# Patient Record
Sex: Male | Born: 1950 | Race: White | Hispanic: No
Health system: Midwestern US, Academic
[De-identification: ages and names within clinical notes are randomized; demographics above are authoritative.]

---

## 2016-02-04 ENCOUNTER — Ambulatory Visit: Admit: 2016-02-04 | Discharge: 2016-02-04 | Payer: Medicare (Managed Care) | Attending: Acute Care

## 2016-02-04 DIAGNOSIS — R0602 Shortness of breath: Secondary | ICD-10-CM

## 2016-02-04 NOTE — Unmapped (Signed)
-----   Message from Irven Shelling, [place] sent at 02/04/2016  3:22 PM EST -----  Communication order for oxygen to new DME  Try to get old records from MD in [place]

## 2016-02-04 NOTE — Progress Notes (Signed)
Initial Outpatient Pulmonary Medicine Consultation     REASON FOR PULMONARY CONSULT:  Chief Complaint   Patient presents with    New Patient Visit/ Consultation     dx with copd around 5-6 years agodx at stage 4,moved back from [place]       HPI  Derrick Garner is a 65 y.o. male who presents for New Patient Visit/ Consultation (dx with copd around 5-6 years agodx at stage 4,moved back from [place])  Pt referred by PCP for COPD, on 2Lnc 02 for last 3 months, diagnosed with COPD 5 yrs ago, quit smoking a month ago, on spiriva, advair he uses sometimes and albuterol HHN and MDI, moved from [place] recently to be closer to his mother. Pt was put on prednisone for wt loss, 20mg  daily for last few months has gained 8lbs. Has been off for a week. Does not feel any different off of prednisone. Has home concentrator and two refillable portable 02 tanks he fills from his concentrator. No back up tank.  Pt seen by pulmonary in [place], [facility].       PAST MEDICAL HISTORY  Past Medical History:   Diagnosis Date    Anxiety     COPD (chronic obstructive pulmonary disease) with emphysema (HCC)     History of alcohol abuse     Hypoxemia requiring supplemental oxygen     Tobacco abuse        SURGICAL HISTORY  Past Surgical History:   Procedure Laterality Date    COLONOSCOPY W/ BIOPSIES AND POLYPECTOMY         CURRENT HOME MEDICATIONS  Current Outpatient Prescriptions   Medication Sig    albuterol Inhale into the lungs.    ALPRAZolam Take by mouth.    fluticasone-salmeterol Inhale into the lungs.    tiotropium Inhale into the lungs.    predniSONE Take by mouth.     No current facility-administered medications for this visit.        ALLERGIES  No Known Allergies    SOCIAL HISTORY  Social History     Social History    Marital status: Divorced     Spouse name: N/A    Number of children: N/A    Years of education: N/A     Social History Main Topics    Smoking status: Former Smoker     Packs/day: 1.50     Years: 45.00      Types: Cigarettes, Cigars     Quit date: 12/28/2015    Smokeless tobacco: Never Used    Alcohol use No      Comment: quit 7yrs ago, hx of heavy alcohol abuse prior    Drug use: Unknown     Types: Marijuana      Comment: Hx of marijuana use not for last 60yrs    Sexual activity: Not Asked     Other Topics Concern    None     Social History Narrative    None     Living with brother and sister in law. 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 One dog.    FAMILY HISTORY  Family History   Problem Relation Age of Onset    Cancer Father     Lung cancer Father     COPD Maternal Uncle        ROS:  CONSTITUTIONAL: No fever/chills or night sweats, no recent weight change.  No sick contacts.  HENT:  No epistaxsis, no nasal discharge, no  sore throat.  CARDIAC: No chest pains or palpitations, no lower extremity edema, no orthopnea, or PND.  RESPIRATORY: No chronic cough, + wheeze, and chronic SOB, does not walk much  GI:  No dysphagia, + heartburn, no melena, no hematochezia.  MUSCULOSKELETAL: No joint pain.  No Raynaud's Phenomenon.  SKIN: No rashes or lesions.  NEURO: No morning headaches, syncope, or TIA symptoms.  PSYCHIATRIC: + depression and  Anxiety has been on xanax for years      PHYSICAL EXAM:  VITAL SIGNS:   Vitals:    02/04/16 1313 02/04/16 1318   BP: 118/72    BP Location: Left arm    Patient Position: Sitting    BP Cuff Size: Regular    Pulse: 105    SpO2: 96% 96%   Weight: 128 lb 12 oz (58.4 kg)    Height: 5' 11 (1.803 m)      Wt Readings from Last 3 Encounters:   02/04/16 128 lb 12 oz (58.4 kg)     GEN: Mild respiratory distress, alert and oriented x3, very thin and chronically ill appearing  HEENT: Atraumatic, normocephalic, pupils equal reactive to light, extraocular movements intact, sclera anicteric, no maxillary or frontal sinus tenderness to palpation,  mucous membranes moist, no oral lesions. Mallampati grade 3.  NECK:  supple, no masses, no lymphadenopathy  CARDIOVASCULAR:  Regular Rate and Rhythm, no murmurs gallops or rubs. Radial pulse 2+ and equal.   LUNGS: Very diminished to ausculation bilaterally, no wheeze, no rhonchi, no rales, no egophony, no dullness to percussion  ABDOMEN: Soft, non-tender, nondistended, bowel sounds present  SKIN: Warm, dry  EXTREMITIES: No clubbing, no pitting edema.  NEURO: CN 2-12 grossly intact. No gross focal deficits.    PSYCH: Normal affect and mood.    Data:                IMPRESSION:   1. Chronic Hypoxemic Respiratory Failure, needs 3L continuous with activity  2. COPD  3. Hx Heavy Tobacco Abuse  4. Hx Alcohol abuse  5. Anxiety  6. GERD    PLAN:   1. Low dose lung cancer screening, ordered  2. Full PFT   3. 6 min walk in office today,88% on RA, walked with 3L conserving device and desat to 85%, walked with 2Lnc and dropped to 88% could not complete 6 min walk  4. Needs humidifier for home concentrator  5. Interested in getting portable home concentrator, will not tolerate conserving device, not appropriate for him  6. Send order to new DME for oxygen supplies, he is worried about losing the money he has paid towards his rent to own concentrator from DME in [place]  7. Try to get old records from [place] if possible  8. Trial of Prilosec for GERD  9. Cont spiriva  10. Cont advair discussed needs to take as directed  11. Cont albuterol HHN and MDI PRN  12. Wants to follow up with doctor for next visit

## 2016-02-04 NOTE — Patient Instructions (Signed)
please call [phone]. To schedule Lung cancer screening CT Scan

## 2016-02-04 NOTE — Telephone Encounter (Signed)
Faxed order,office note and demos for pts O2 to [facility]  Fax confirmation received

## 2016-02-05 NOTE — Telephone Encounter (Signed)
Chase from [facility] requesting Rx & qualifying testing for order.    Ph   [phone]  Fax  [phone]

## 2016-02-06 NOTE — Telephone Encounter (Signed)
Lm with [facility] to call office back.

## 2016-02-06 NOTE — Telephone Encounter (Signed)
Derrick Garner from [facility] called and said that he received the oxygen order but it did not have any liter flow on it . He states that it says 85 on 2 liters and 83 on 3 liters but doesn't actually have a liter flow on it. Please advise. [name] is requesting that we re-fax the order. Please advise.

## 2016-02-06 NOTE — Telephone Encounter (Signed)
Faxed 6mw to [facility]

## 2016-02-09 NOTE — Telephone Encounter (Signed)
Chase from [facility] f/up on msg from Friday.   Pls advise.

## 2016-02-09 NOTE — Telephone Encounter (Signed)
Faxed order to [facility] at [place].

## 2016-02-10 NOTE — Telephone Encounter (Signed)
Faxed oxygen/nebulizer therapy order form to [facility]  -confirmation received and sent to scan.

## 2016-03-02 NOTE — Telephone Encounter (Signed)
Faxed initial prescription for oxygen to [facility].

## 2016-03-05 ENCOUNTER — Ambulatory Visit: Admit: 2016-03-05 | Payer: Medicare (Managed Care)

## 2016-03-05 DIAGNOSIS — R972 Elevated prostate specific antigen [PSA]: Secondary | ICD-10-CM

## 2016-03-05 MED ORDER — tamsulosin (FLOMAX) 0.4 mg Cp24
0.4 | ORAL_CAPSULE | Freq: Every evening | ORAL | 3 refills | Status: AC
Start: 2016-03-05 — End: 2016-09-13

## 2016-03-05 NOTE — Unmapped (Signed)
Chief complaints:    Hematuria   Elevated PSA    History of Present Illness  HEMATURIA    Chief Complaint:    65 year old male who is here because of a history of elevated PSA.  He has been referred to [place] by Dr. Dessie Garner.  He has a long-standing history of lower urinary tract symptoms i.e. frequency, urgency and nocturia for many years.  He had a transurethral resection of the prostate, 3 years ago in [place].  This was complicated by gross hematuria and retention of urine and subsequently.  He has grade 4 COPD and is currently on oxygen.  He continues to have urinary frequency and nocturia.  His latest PSA was 10.4 ng per mL.  He had a urinary tract infection a few weeks ago and was treated for the same.      PSA is 10 ng/ml.      Review of Systems   Genitourinary: Positive for difficulty urinating, frequency, hematuria and nocturia.   All other systems reviewed and are negative.      Allergies  Patient has no known allergies.    Medications  Outpatient Encounter Prescriptions as of 03/05/2016   Medication Sig Dispense Refill   ??? albuterol (PROVENTIL;VENTOLIN;PROAIR) 90 mcg/actuation inhaler Inhale into the lungs.     ??? ALPRAZolam (XANAX) 1 MG tablet Take by mouth.     ??? fluticasone-salmeterol (ADVAIR) 250-50 mcg/dose diskus inhaler Inhale into the lungs.     ??? predniSONE (DELTASONE) 20 MG tablet Take by mouth.     ??? tiotropium (SPIRIVA) 18 mcg Inhale into the lungs.       No facility-administered encounter medications on file as of 03/05/2016.         Histories  He has a past medical history of Anxiety; COPD (chronic obstructive pulmonary disease) with emphysema (HCC); History of alcohol abuse; Hypoxemia requiring supplemental oxygen; and Tobacco abuse.    He has a past surgical history that includes Colonoscopy w/ biopsies and polypectomy.    His family history includes COPD in his maternal uncle; Cancer in his father; Lung cancer in his father.    He reports that he quit smoking about 2 months ago. His smoking use  included Cigarettes and Cigars. He has a 67.50 pack-year smoking history. He has never used smokeless tobacco. He reports that he has current or past drug history, including Marijuana. He reports that he does not drink alcohol.    The following portions of the patient's history were reviewed and updated as appropriate: allergies, current medications, past family history, past medical history, past social history, past surgical history and problem list.    There were no vitals taken for this visit.  Physical Exam   Constitutional: He is oriented to person, place, and time. He appears well-developed and well-nourished.   HENT:   Head: Normocephalic and atraumatic.   Mouth/Throat: Oropharynx is clear and moist.   Eyes: EOM are normal.   Neck: Normal range of motion. Neck supple.   Cardiovascular: Normal rate, regular rhythm and normal heart sounds.    Pulmonary/Chest: Effort normal and breath sounds normal. He has no wheezes.   Abdominal: Soft. Bowel sounds are normal. He exhibits no distension. There is no tenderness. There is no rebound and no guarding. Hernia confirmed negative in the right inguinal area and confirmed negative in the left inguinal area.   Genitourinary: Testes normal and penis normal. Prostate is enlarged. Prostate is not tender.   Musculoskeletal: Normal range of motion.  Lymphadenopathy:        Right: No inguinal adenopathy present.        Left: No inguinal adenopathy present.   Neurological: He is alert and oriented to person, place, and time.   Skin: Skin is warm.   Psychiatric: He has a normal mood and affect. His behavior is normal. Judgment and thought content normal.            Assessment    Derrick Garner is a  65 y.o.  yr. old malew who is referred to me by Dr.Burghard  for the evaluation and management of elevated PSA and lower urinary tract symptoms.  Please see history of presenting illness for details. He has had a long-standing history of lower urinary tract symptoms. He has had a  transurethral resection of the prostate 3 years ago.    Clinical examination reveals an enlarged prostate. Feels benign.  Given the history of recent urinary tract infection, possible history of transurethral resection of the prostate, and lower urinary tract symptoms, I think he should be on Flomax.  After being on Flomax for a few months, we will repeat the PSA. I have asked him to get the PSA in the UC lab.  He will see me in 3 months.     Plan:  1. Tablet tamsulosin 0.4 mg at bedtime  2. PSA free and total in 3 months  3. Follow-up in 3 months  4. Early follow-up if he has any problems        Medical Decision Making  The following items were considered in medical decision making:  Review / order clinical lab tests  Review / order radiology tests  Review / order other diagnostic tests/interventions  Reviewed outside records

## 2016-03-08 ENCOUNTER — Inpatient Hospital Stay: Admit: 2016-03-08 | Payer: Medicare (Managed Care) | Attending: Acute Care

## 2016-03-08 DIAGNOSIS — R0602 Shortness of breath: Secondary | ICD-10-CM

## 2016-03-08 LAB — PFT13-PULMONARY FUNCTION TEST
DLCO%: 23
DLCO: 7.29
FEV1%: 20
FEV1/FVC EXP: 75
FEV1/FVC: 25
FEV1: 0.7
FVC%: 58
FVC: 2.78
RESPONSE TO BRONCHODILATOR: 6
RV%: 258
RV: 6.61
TLC%: 129
TLC: 9.44
VC%: 59
VC: 2.83

## 2016-03-08 MED ORDER — albuterol (PROVENTIL) nebulizer solution 2.5 mg
2.5 | Freq: Once | RESPIRATORY_TRACT | Status: AC
Start: 2016-03-08 — End: 2016-03-08
  Administered 2016-03-08: 19:00:00 2.5 mg via RESPIRATORY_TRACT

## 2016-03-08 MED FILL — ALBUTEROL SULFATE 2.5 MG/3 ML (0.083 %) SOLUTION FOR NEBULIZATION: 2.5 2.5 mg /3 mL (0.083 %) | RESPIRATORY_TRACT | Qty: 3

## 2016-03-08 NOTE — Consults (Signed)
[facility]    Pulmonary Function Tests Interpretation    Derrick Garner is a 65 y.o. male who had a pulmonary function test performed at the [facility].    Obstructive defect is present.  Spirometry shows a very severe obstructive defect.  There is a significant response to bronchodilator demonstrated.  Hyperinflation is present.  Air trapping is present.  Diffusing capacity is severely decreased.    Read performed by:  Metro Kung, MD 03/09/2016 3:47 PM        PFT/ABG/6MW/10MW 03/08/2016   FEV1 (liters) 0.70   FEV1% 20   FVC (liters) 2.78   FVC% 58   FEV1/FVC (%) 25   TLC (liters) 9.44   TLC% 129   RV 6.61   RV% 258   VC 2.83   VC% 59   DLCO (ml/mmHg sec) 7.29   DLCO % 23

## 2016-03-12 NOTE — Telephone Encounter (Signed)
Spoke with pt adv that he should call the lung cancer program and see about having that done prior to his appt on 04-05-16.  Pt sts that he will call Monday and see about getting an appt for around the first of the year.

## 2016-03-15 ENCOUNTER — Encounter

## 2016-03-15 ENCOUNTER — Encounter: Payer: Medicare (Managed Care) | Attending: Acute Care

## 2016-03-24 NOTE — Progress Notes (Signed)
Derrick Garner called the lung screening office after talking with pulmonologist, Derrick Garner.  Initial nursing intake assessment completed to confirm eligibility.  Reviewed risks and benefits of low dose CT during conversation.  Instructed him to check in at the registration desk before his scheduled time and provide a vigorous cough immediately prior to scan to remove mucous secretions.  Requested pt call the screening office 24-48 hours post scan to review final report.  Informed that a patient letter would be mailed to the home address with follow-up instructions and appointment scheduled per recommendation of the reading radiologist, as well as a copy of ldct report. Pt had no additional questions and verbalized understanding.       LUNG CANCER SCREENING INITIAL NURSING ASSESSMENT    Patient Name: Derrick Garner  DOB: 12-05-1950  Age65 y.o. [id number]    Gender   Race: White or Caucasian    Address: [address] [place] [address]    Home Phone: [phone]     Cell Phone:      Other:     Referring Physician: Netta Neat, Garner   PCP:Joe Heron Nay, MD     Next Appt:    Primary Insurance: [employer]   ID:     How did you learn about the program?: pulmonologist    Height: 71   Weight: 132       Eligibility Screening  Smoking History  Current smoker: no     Year started: 14     Year stopped 2017 (quit for 5 years once then restarted)    Packs Per Day 1.5     Years Smoked 40     Pack Years 48     Quit smoking for < 15 years (2002 - 2017): yes    Smoke Cessation Offered:na     Risk Factors    COPD / Asthma, Emphysema, Bronchitis: COPD     Pulmonary Fibrosis: no         Radon Exposure: no    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     Shared Decision Making per Patient Report yes    Prior Chest Imaging: no , If yes where/when/location     Acquired:na     ELIGIBLE FOR SCREENING yes     Date/Time/Location: 04/07/16 [facility]

## 2016-04-05 ENCOUNTER — Ambulatory Visit: Payer: Medicare (Managed Care) | Attending: Acute Care

## 2016-04-07 ENCOUNTER — Inpatient Hospital Stay: Payer: Medicare (Managed Care)

## 2016-04-21 ENCOUNTER — Inpatient Hospital Stay: Admit: 2016-04-21 | Payer: Medicare (Managed Care)

## 2016-04-21 ENCOUNTER — Ambulatory Visit: Admit: 2016-04-21 | Discharge: 2016-04-21 | Payer: Medicare (Managed Care) | Attending: Acute Care

## 2016-04-21 DIAGNOSIS — Z122 Encounter for screening for malignant neoplasm of respiratory organs: Secondary | ICD-10-CM

## 2016-04-21 DIAGNOSIS — J449 Chronic obstructive pulmonary disease, unspecified: Secondary | ICD-10-CM

## 2016-04-21 MED ORDER — albuterol (PROVENTIL;VENTOLIN;PROAIR) 90 mcg/actuation inhaler
90 | RESPIRATORY_TRACT | 11 refills | Status: AC | PRN
Start: 2016-04-21 — End: ?

## 2016-04-21 NOTE — Unmapped (Signed)
Spoke with Flint Melter at [facility]. Requested to see if pt had a CT Chest or PFT.  She sent a message to her secretary to check on these records. I left our fax and phone number.

## 2016-04-21 NOTE — Unmapped (Signed)
Outpatient Pulmonary Medicine Follow Up visit       HPI  Derrick Garner is a 66 y.o. male who presents for Follow-up (pft results and just had ct scan at 2pm . insurance will no longer pay for the Proventil HFA); Cough (coughing up a lot of phelgm- clear, some yellow sometimes it looks like a bubble. comes and goes. Thinks maybe it is from certain foods. he will have a coughing fit with food. ); Sinus Problem (runny nose. ); Fatigue (could sleep all day and could be up all night. ); and Edema (feet, pain sometimes. )  Overall feeling about the same. SOB the same, some cough with clear to yellow sputum at times, no change. No fevers or chills. Had PFT and CT lung cancer screening since last visit. Have not gotten records from previous pulmonary doctor, he says was a close friend and did not charge him for his visits. Got new oxygen equipment from DME and happy with the service.     Previous Note:  Pt referred by PCP for COPD, on 2Lnc 02 for last 3 months, diagnosed with COPD 5 yrs ago, quit smoking a month ago, on spiriva, advair he uses sometimes and albuterol HHN and MDI, moved from [place] recently to be closer to his mother. Pt was put on prednisone for wt loss, 20mg  daily for last few months has gained 8lbs. Has been off for a week. Does not feel any different off of prednisone. Has home concentrator and two refillable portable 02 tanks he fills from his concentrator. No back up tank.  Pt seen by pulmonary in [place], [facility].     ROS:  CONSTITUTIONAL: No fever/chills or night sweats, no recent weight change.  No sick contacts.  HENT:  No epistaxsis, no nasal discharge, no sore throat.  CARDIAC: No chest pains or palpitations, no lower extremity edema, no orthopnea, or PND.  RESPIRATORY: No chronic cough, + wheeze, and chronic SOB, does not walk much  GI:  No dysphagia, + heartburn, no melena, no hematochezia.  MUSCULOSKELETAL: No joint pain.  No Raynaud's Phenomenon.  SKIN: No rashes or lesions.  NEURO: No  morning headaches, syncope, or TIA symptoms.  PSYCHIATRIC: + depression and  Anxiety has been on xanax for years      PHYSICAL EXAM:  VITAL SIGNS:   Vitals:    04/21/16 1450   Pulse: 93   SpO2: 96%   Weight: 132 lb (59.9 kg)   Height: 5' 11 (1.803 m)     Wt Readings from Last 3 Encounters:   04/21/16 132 lb (59.9 kg)   03/05/16 132 lb (59.9 kg)   02/04/16 128 lb 12 oz (58.4 kg)     GEN: Mild respiratory distress, alert and oriented x3, very thin and chronically ill appearing  HEENT: Atraumatic, normocephalic, pupils equal reactive to light, extraocular movements intact, sclera anicteric, no maxillary or frontal sinus tenderness to palpation,  mucous membranes moist, no oral lesions. Mallampati grade 3.  NECK:  supple, no masses, no lymphadenopathy  CARDIOVASCULAR: Regular Rate and Rhythm, no murmurs gallops or rubs. Radial pulse 2+ and equal.   LUNGS: Very diminished to ausculation bilaterally, no wheeze, no rhonchi, no rales, no egophony, no dullness to percussion  ABDOMEN: Soft, non-tender, nondistended, bowel sounds present  SKIN: Warm, dry  EXTREMITIES: No clubbing, no pitting edema.  NEURO: CN 2-12 grossly intact. No gross focal deficits.    PSYCH: Normal affect and mood.    Data:  03/08/16 PFT  Pulmonary function test   Order: [id number]   Status:  Final result ????Visible to patient:  No (Not Released) Next appt:  06/04/2016 at 02:00 PM in Urology Surgery Timoteo Expose, MD) Dx:  Hypoxemia requiring supplemental oxyg...    03/08/16 12:41 PM   FEV1 0.70    FEV1% 20    FVC 2.78    FVC% 58    FEV1/FVC 25    FEV1/FVC EXP 75    RESPONSE TO BRONCHODILATOR 6% FVC, 5% FEV1    TLC 9.44    TLC% 129    RV 6.61    RV% 258    VC 2.83    VC% 59    DLCO 7.29    DLCO% 23            Obstructive defect is present.  Spirometry shows a very severe obstructive defect.  There is a significant response to bronchodilator demonstrated.  Hyperinflation is present.  Air trapping is present.  Diffusing capacity is severely  decreased.    02/04/16  6 min walk in office today,88% on RA, walked with 3L conserving device and desat to 85%, walked with 2Lnc and dropped to 88% could not complete 6 min walk      04/21/16 CT Lung Cancer Screening  IMPRESSION:  ??  6 mm subpleural right lower lobe nodule is indeterminate. Compare to any prior exams if available otherwise follow-up as below.  ??  Right middle lobe bronchiectasis may be related to may be postinflammatory, another consideration is atypical mycobacterial infection given location.  ??  Irregular opacity at bilateral lung apices is likely due to prior granulomatous disease. Recommend comparison with any prior imaging to assess for stability or attention on follow-up.  ??  Severe dilation of left renal pelvis without identifiable cause on chest CT. Recommend comparison to any prior imaging or further evaluation.  ??  Hyperdense 1.5 cm lesion left renal upper pole, cannot be characterized without intravenous contrast and consider renal protocol CT or ultrasound for further evaluation.  ??  Significant dilation of distal stomach/ proximal duodenum with heterogeneous density within this region which may represent retained material however is not well evaluated and assessment for possible mass is limited.            IMPRESSION:   1. Chronic Hypoxemic Respiratory Failure, needs 3L continuous with activity  2. Very Severe COPD  3. Severe Emphysema  4. Hx Heavy Tobacco Abuse  5. Hx Alcohol abuse  6. Anxiety  7. GERD  8. LE Edema    PLAN:   1. Alpha 1 antitrypsin with severe emphysema  2. Full PFT reviewed Very Sever Obstruction with significant bronchodilator response, FEV 1 20%  3. CT Lung cancer screening reviewed with Dr. Damaris Hippo in office, RLL subpleural nodule 6mm, noted will need follow up in 3 months, RML bronchiectasis, possible infectious, consider bronchoscopy, hold off for now and get sputum cx and sputum afb  4. Previously discussed he is Interested in Secondary school teacher,  will not tolerate conserving device, not appropriate for him  5. Try to get old records from [place] if possible  6. Trial of Prilosec for GERD  7. Cont spiriva  8. Cont advair discussed needs to take as directed  9. Cont albuterol HHN and MDI PRN  10. Echo   11. Attempt to get old records again  45. . Plan for 1 month follow up with Dr. Damaris Hippo

## 2016-04-26 NOTE — Telephone Encounter (Signed)
error 

## 2016-04-26 NOTE — Progress Notes (Signed)
Old PFT received by fax, FEV 1  20% in 2016, sent to scan. Note there is no old CT chest.

## 2016-04-29 NOTE — Unmapped (Signed)
[facility]IP[facility]ORACIC ONCOLOGY CONFERENCE    DATE: 04/27/2016    TIME: 1045    DISCUSSION: severe dilation of renal pelvis, hyperdense 1.5 lesion of left renal upper pole, significant dilation of distal stomach/proximal duodenum with heterogeneous density, evaluation for possible mass is limited.    CONSENSUS RECOMMENDATION: Follow-up LDCT 6 months, renal protocol if not already done, CT ABD/PEL with contrast.    PCP/REFERRING MD COMMUNICATION: Recommendations will be routed via in basket to PCP, Pulmonologist and Urology upon completion of final report.    PATIENT COMMUNICATION: Patient letter with follow-up recommendations will be forwarded to home address and phone call placed to provide additional instructions and answer questions.

## 2016-05-19 ENCOUNTER — Ambulatory Visit: Payer: Medicare (Managed Care)

## 2016-05-26 MED ORDER — tiotropium (SPIRIVA) 18 mcg
18 | ORAL_CAPSULE | Freq: Every day | RESPIRATORY_TRACT | 6 refills | 30.00000 days | Status: AC
Start: 2016-05-26 — End: ?

## 2016-06-02 ENCOUNTER — Ambulatory Visit: Payer: Medicare (Managed Care) | Attending: Pulmonary Disease

## 2016-07-05 NOTE — Progress Notes (Deleted)
Chief complaints:  LUTS        Previous note:  Assessment    Derrick Garner is a  66 y.o.  yr. old malew who is referred to me by Dr.Burghard  for the evaluation and management of elevated PSA and lower urinary tract symptoms.  Please see history of presenting illness for details. He has had a long-standing history of lower urinary tract symptoms. He has had a transurethral resection of the prostate 3 years ago.    Clinical examination reveals an enlarged prostate. Feels benign.  Given the history of recent urinary tract infection, possible history of transurethral resection of the prostate, and lower urinary tract symptoms, I think he should be on Flomax.  After being on Flomax for a few months, we will repeat the PSA. I have asked him to get the PSA in the UC lab.  He will see me in 3 months.     Plan:  1. Tablet tamsulosin 0.4 mg at bedtime  2. PSA free and total in 3 months  3. Follow-up in 3 months  4. Early follow-up if he has any problems       History of Present Illness  Benign Prostatic Hyperplasia (BPH) Follow-up:    Chief Complaint: BPH with elevated PSA  Brief History:      66 yr old male who is here for a follow up.  He is here after a PSA test.    No results found for: PSA    AUA total score:        Status:  {status:[id number]}    Current Medications;    Current Outpatient Prescriptions:     albuterol (PROVENTIL) 2.5 mg /3 mL (0.083 %) nebulizer solution, daily as needed., Disp: , Rfl:     albuterol (PROVENTIL;VENTOLIN;PROAIR) 90 mcg/actuation inhaler, Inhale 2 puffs into the lungs every 4 hours as needed for Wheezing., Disp: 1 Inhaler, Rfl: 11    ALPRAZolam (XANAX) 1 MG tablet, Take 1 mg by mouth 4 times a day.      , Disp: , Rfl:     DIPHENHYDRAMINE HCL (BENADRYL ORAL), Take by mouth if needed.      , Disp: , Rfl:     fluticasone-salmeterol (ADVAIR) 250-50 mcg/dose diskus inhaler, Inhale into the lungs., Disp: , Rfl:     IBUPROFEN (ADVIL ORAL), Take by mouth if needed.      , Disp: , Rfl:      predniSONE (DELTASONE) 20 MG tablet, Take by mouth., Disp: , Rfl:     tamsulosin (FLOMAX) 0.4 mg Cp24, Take 1 capsule (0.4 mg total) by mouth at bedtime., Disp: 30 capsule, Rfl: 3    tiotropium (SPIRIVA) 18 mcg, Inhale 1 capsule (18 mcg total) into the lungs daily., Disp: 30 capsule, Rfl: 6  {continue/change:[id number]}    PSA:  {psa:[phone]}    AUA Symptom Score:  Today's Score:      Status:  {status:[phone]}      Other Comments:  ***           Review of Systems   Genitourinary: Positive for difficulty urinating, frequency, nocturia and urgency.   All other systems reviewed and are negative.      Allergies  Patient has no known allergies.    Medications  Outpatient Encounter Prescriptions as of 07/05/2016   Medication Sig Dispense Refill    albuterol (PROVENTIL) 2.5 mg /3 mL (0.083 %) nebulizer solution daily as needed.      albuterol (PROVENTIL;VENTOLIN;PROAIR) 90 mcg/actuation inhaler Inhale 2 puffs  into the lungs every 4 hours as needed for Wheezing. 1 Inhaler 11    ALPRAZolam (XANAX) 1 MG tablet Take 1 mg by mouth 4 times a day.                DIPHENHYDRAMINE HCL (BENADRYL ORAL) Take by mouth if needed.                fluticasone-salmeterol (ADVAIR) 250-50 mcg/dose diskus inhaler Inhale into the lungs.      IBUPROFEN (ADVIL ORAL) Take by mouth if needed.                predniSONE (DELTASONE) 20 MG tablet Take by mouth.      tamsulosin (FLOMAX) 0.4 mg Cp24 Take 1 capsule (0.4 mg total) by mouth at bedtime. 30 capsule 3    tiotropium (SPIRIVA) 18 mcg Inhale 1 capsule (18 mcg total) into the lungs daily. 30 capsule 6     No facility-administered encounter medications on file as of 07/05/2016.         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    He has a past surgical history that includes Colonoscopy w/ biopsies and polypectomy.    His  family history includes COPD in his maternal uncle; Cancer in his father; Lung Cancer in his father.    He reports that he quit smoking about 6 months ago. His smoking use included Cigarettes and Cigars. He has a 67.50 pack-year smoking history. He has never used smokeless tobacco. He reports that he has current or past drug history, including Marijuana. He reports that he does not drink alcohol.    The following portions of the patient's history were reviewed and updated as appropriate: allergies, current medications, past family history, past medical history, past social history, past surgical history and problem list.    There were no vitals taken for this visit.  Physical Exam   Constitutional: He is oriented to person, place, and time. He appears well-developed and well-nourished.   HENT:   Head: Normocephalic and atraumatic.   Mouth/Throat: Oropharynx is clear and moist.   Eyes: EOM are normal.   Neck: Normal range of motion. Neck supple.   Cardiovascular: Normal rate, regular rhythm and normal heart sounds.    Pulmonary/Chest: Effort normal and breath sounds normal. He has no wheezes.   Abdominal: Soft. Bowel sounds are normal. He exhibits no distension. There is no tenderness. There is no rebound and no guarding.   Musculoskeletal: Normal range of motion.   Neurological: He is alert and oriented to person, place, and time.   Skin: Skin is warm.   Psychiatric: He has a normal mood and affect. His behavior is normal. Judgment and thought content normal.         Assessment      Plan      Medical Decision Making  The following items were considered in medical decision making:  Review / order clinical lab tests  Review / order radiology tests  Review / order other diagnostic tests/interventions

## 2016-07-29 ENCOUNTER — Other Ambulatory Visit: Admit: 2016-07-29 | Payer: Medicare (Managed Care)

## 2016-07-29 ENCOUNTER — Ambulatory Visit: Admit: 2016-07-29 | Discharge: 2016-07-29 | Payer: Medicare (Managed Care) | Attending: Pulmonary Disease

## 2016-07-29 DIAGNOSIS — J449 Chronic obstructive pulmonary disease, unspecified: Secondary | ICD-10-CM

## 2016-07-29 LAB — RENAL FUNCTION PANEL W/EGFR
Albumin: 4.2 g/dL (ref 3.5–5.7)
Anion Gap: 8 mmol/L (ref 3–16)
BUN: 16 mg/dL (ref 7–25)
CO2: 32 mmol/L (ref 21–33)
Calcium: 9.2 mg/dL (ref 8.6–10.3)
Chloride: 103 mmol/L (ref 98–110)
Creatinine: 1.21 mg/dL (ref 0.60–1.30)
Glucose: 91 mg/dL (ref 70–100)
Osmolality, Calculated: 297 mOsm/kg (ref 278–305)
Phosphorus: 3.1 mg/dL (ref 2.1–4.5)
Potassium: 4.3 mmol/L (ref 3.5–5.3)
Sodium: 143 mmol/L (ref 133–146)
eGFR AA CKD-EPI: 72 See note.
eGFR NONAA CKD-EPI: 62 See note.

## 2016-07-29 LAB — DIFFERENTIAL
Basophils Absolute: 52 /uL (ref 0–200)
Basophils Relative: 0.6 % (ref 0.0–1.0)
Eosinophils Absolute: 209 /uL (ref 15–500)
Eosinophils Relative: 2.4 % (ref 0.0–8.0)
Lymphocytes Absolute: 1905 /uL (ref 850–3900)
Lymphocytes Relative: 21.9 % (ref 15.0–45.0)
Monocytes Absolute: 513 /uL (ref 200–950)
Monocytes Relative: 5.9 % (ref 0.0–12.0)
Neutrophils Absolute: 6020 /uL (ref 1500–7800)
Neutrophils Relative: 69.2 % (ref 40.0–80.0)
nRBC: 0 /100{WBCs} (ref 0–0)

## 2016-07-29 LAB — CBC
Hematocrit: 42.2 % (ref 38.5–50.0)
Hemoglobin: 14.1 g/dL (ref 13.2–17.1)
MCH: 30.5 pg (ref 27.0–33.0)
MCHC: 33.5 g/dL (ref 32.0–36.0)
MCV: 91 fL (ref 80.0–100.0)
MPV: 7.6 fL (ref 7.5–11.5)
Platelets: 273 10*3/uL (ref 140–400)
RBC: 4.64 10*6/uL (ref 4.20–5.80)
RDW: 13.4 % (ref 11.0–15.0)
WBC: 8.7 10*3/uL (ref 3.8–10.8)

## 2016-07-29 LAB — ALPHA-1-ANTITRYPSIN: A-1 Antitrypsin: 179 mg/dL (ref 84.0–218.0)

## 2016-07-29 LAB — IGE: IgE: 13.7 IU/mL (ref 5.0–164.0)

## 2016-07-29 NOTE — Progress Notes (Signed)
Outpatient Pulmonary Medicine Follow Up visit       HPI  Arthor Stelma is a 66 y.o. male who presents for Follow-up  Overall feeling about the same. SOB the same, some cough with clear to yellow sputum at times, no change.   Uses Advair and Spiriva, occasional Albuterol  No fevers or chills. Had PFT and CT lung cancer screening   Have not gotten records from previous pulmonary doctor, he says was a close friend and did not charge him for his visits.   Got new oxygen equipment from DME and happy with the service.   Weight is down    Previous Note:  Pt referred by PCP for COPD, on 2Lnc 02 for last 3 months, diagnosed with COPD 5 yrs ago, quit smoking a month ago, on spiriva, advair he uses sometimes and albuterol HHN and MDI, moved from [place] recently to be closer to his mother. Pt was put on prednisone for wt loss, 20mg  daily for last few months has gained 8lbs. Has been off for a week. Does not feel any different off of prednisone. Has home concentrator and two refillable portable 02 tanks he fills from his concentrator. No back up tank.  Pt seen by pulmonary in [place], [facility].     ROS:  CONSTITUTIONAL: No fever/chills or night sweats, no recent weight change.  No sick contacts.  HENT:  No epistaxsis, no nasal discharge, no sore throat.  CARDIAC: No chest pains or palpitations, no lower extremity edema, no orthopnea, or PND.  RESPIRATORY: No chronic cough, + wheeze, and chronic SOB, does not walk much  GI:  No dysphagia, + heartburn, no melena, no hematochezia.  MUSCULOSKELETAL: No joint pain.  No Raynaud's Phenomenon.  SKIN: No rashes or lesions.  NEURO: No morning headaches, syncope, or TIA symptoms.  PSYCHIATRIC: + depression and  Anxiety has been on xanax for years  All other review of systems are negative    PHYSICAL EXAM:  VITAL SIGNS:   Vitals:    07/29/16 1318 07/29/16 1321   BP: 116/70    BP Location: Right arm    Patient Position: Sitting    BP Cuff Size: Regular    Pulse: 90    SpO2: 95% 95%    Weight: 126 lb 6.4 oz (57.3 kg)    Height: 5' 10 (1.778 m)      Wt Readings from Last 3 Encounters:   07/29/16 126 lb 6.4 oz (57.3 kg)   04/21/16 132 lb (59.9 kg)   03/05/16 132 lb (59.9 kg)     GEN: Mild respiratory distress, alert and oriented x3, very thin and chronically ill appearing  HEENT: Atraumatic, normocephalic  NECK:  supple, no masses, no lymphadenopathy  CARDIOVASCULAR: Regular Rate and Rhythm, no murmurs gallops or rubs. Radial pulse 2+ and equal.   LUNGS: Very diminished to ausculation bilaterally, no wheeze, no rhonchi, no rales, no egophony, no dullness to percussion  ABDOMEN: Soft, non-tender, nondistended, bowel sounds present  SKIN: Warm, dry  EXTREMITIES: No clubbing, 1+ pitting edema.  NEURO: CN 2-12 grossly intact. No gross focal deficits.    PSYCH: Normal affect and mood.    Data:    03/08/16 PFT  Pulmonary function test   Order: [id number]   Status:  Final result Visible to patient:  No (Not Released) Next appt:  06/04/2016 at 02:00 PM in Urology Surgery Timoteo Expose, MD) Dx:  Hypoxemia requiring supplemental oxyg...    03/08/16 12:41 PM   FEV1  0.70    FEV1% 20    FVC 2.78    FVC% 58    FEV1/FVC 25    FEV1/FVC EXP 75    RESPONSE TO BRONCHODILATOR 6% FVC, 5% FEV1    TLC 9.44    TLC% 129    RV 6.61    RV% 258    VC 2.83    VC% 59    DLCO 7.29    DLCO% 23            Obstructive defect is present.  Spirometry shows a very severe obstructive defect.  There is a significant response to bronchodilator demonstrated.  Hyperinflation is present.  Air trapping is present.  Diffusing capacity is severely decreased.    02/04/16  6 min walk in office today,88% on RA, walked with 3L conserving device and desat to 85%, walked with 2Lnc and dropped to 88% could not complete 6 min walk      04/21/16 CT Lung Cancer Screening  IMPRESSION:    6 mm subpleural right lower lobe nodule is indeterminate. Compare to any prior exams if available otherwise follow-up as below.    Right middle lobe bronchiectasis  may be related to may be postinflammatory, another consideration is atypical mycobacterial infection given location.    Irregular opacity at bilateral lung apices is likely due to prior granulomatous disease. Recommend comparison with any prior imaging to assess for stability or attention on follow-up.    Severe dilation of left renal pelvis without identifiable cause on chest CT. Recommend comparison to any prior imaging or further evaluation.    Hyperdense 1.5 cm lesion left renal upper pole, cannot be characterized without intravenous contrast and consider renal protocol CT or ultrasound for further evaluation.    Significant dilation of distal stomach/ proximal duodenum with heterogeneous density within this region which may represent retained material however is not well evaluated and assessment for possible mass is limited.      IMPRESSION:   1. Chronic Hypoxemic Respiratory Failure, needs 3L continuous with activity  2. Very Severe COPD  3. Severe Emphysema  4. Hx Heavy Tobacco Abuse  5. Hx Alcohol abuse  6. Anxiety  7. GERD  8. LE Edema    PLAN:   1. Check Alpha 1 antitrypsin with severe emphysema  2. Full PFT reviewed Very Sever Obstruction with significant bronchodilator response, FEV 1 20%  3. Repeat CT lung screening in 3 months (6 months from previous one per radiology consensus)  4. Previously discussed he is Interested in Secondary school teacher, will not tolerate conserving device, not appropriate for him  5. Referral to Rehab. He considers being evaluated for transplant  6. Trial of Prilosec for GERD  7. Cont spiriva  8. Cont advair discussed needs to take as directed  9. Cont albuterol HHN and MDI PRN  10. Check Echo   11. We discussed to follow up with Dr Dessie Coma in regard to his kidney lesion- he has an appointment next week  12. Flu shot recommended every year

## 2016-07-30 NOTE — Progress Notes (Signed)
VM left for this patient to start pulmonary rehab.   Derrick Garner RRT

## 2016-07-30 NOTE — Unmapped (Signed)
Placed call to pt per Dr. Damaris Hippo   Left voicemail with results below    Please let him know that blood work is ok

## 2016-08-16 NOTE — Unmapped (Signed)
Pt no-showed today for his Pulmonary Rehab Evaluation. VM left to reschedule.     Javien Tesch Louie Casa RRT

## 2016-09-03 NOTE — Unmapped (Signed)
Left messages to schedule orientation on June 5, 6 and 7 with no answer or response.

## 2016-09-13 ENCOUNTER — Emergency Department: Admit: 2016-09-13 | Payer: Medicare (Managed Care)

## 2016-09-13 ENCOUNTER — Inpatient Hospital Stay
Admission: EM | Admit: 2016-09-13 | Discharge: 2016-09-17 | Disposition: A | Discharge status: Skilled Nursing Facility | Payer: Medicare (Managed Care)

## 2016-09-13 DIAGNOSIS — N453 Epididymo-orchitis: Secondary | ICD-10-CM

## 2016-09-13 LAB — CBC
Hematocrit: 39.1 % (ref 38.5–50.0)
Hemoglobin: 13.4 g/dL (ref 13.2–17.1)
MCH: 30.3 pg (ref 27.0–33.0)
MCHC: 34.2 g/dL (ref 32.0–36.0)
MCV: 88.4 fL (ref 80.0–100.0)
MPV: 7.1 fL — ABNORMAL LOW (ref 7.5–11.5)
Platelets: 426 10E3/uL — ABNORMAL HIGH (ref 140–400)
RBC: 4.43 10E6/uL (ref 4.20–5.80)
RDW: 12.9 % (ref 11.0–15.0)
WBC: 16.8 10E3/uL — ABNORMAL HIGH (ref 3.8–10.8)

## 2016-09-13 LAB — BASIC METABOLIC PANEL
Anion Gap: 8 mmol/L (ref 3–16)
BUN: 19 mg/dL (ref 7–25)
CO2: 31 mmol/L (ref 21–33)
Calcium: 9.3 mg/dL (ref 8.6–10.3)
Chloride: 98 mmol/L (ref 98–110)
Creatinine: 1.5 mg/dL (ref 0.60–1.30)
Glucose: 90 mg/dL (ref 70–100)
Osmolality, Calculated: 286 mOsm/kg (ref 278–305)
Potassium: 4.5 mmol/L (ref 3.5–5.3)
Sodium: 137 mmol/L (ref 133–146)
eGFR AA CKD-EPI: 55 See note.
eGFR NONAA CKD-EPI: 48 See note.

## 2016-09-13 LAB — HEPATIC FUNCTION PANEL
ALT: 15 U/L (ref 7–52)
AST: 13 U/L (ref 13–39)
Albumin: 3.4 g/dL (ref 3.5–5.7)
Alkaline Phosphatase: 61 U/L (ref 36–125)
Bilirubin, Direct: 0.2 mg/dL (ref 0.0–0.4)
Bilirubin, Indirect: 0.4 mg/dL (ref 0.0–1.1)
Total Bilirubin: 0.6 mg/dL (ref 0.0–1.5)
Total Protein: 7.3 g/dL (ref 6.4–8.9)

## 2016-09-13 LAB — URINALYSIS W/RFL TO MICROSCOPIC
Bilirubin, UA: NEGATIVE
Glucose, UA: NEGATIVE mg/dL
Ketones, UA: NEGATIVE mg/dL
Nitrite, UA: NEGATIVE
Protein, UA: 100 mg/dL — AB
RBC, UA: 22 /HPF — ABNORMAL HIGH (ref 0–3)
Specific Gravity, UA: 1.012 (ref 1.005–1.035)
Urobilinogen, UA: <2 mg/dL (ref 0.2–1.9)
WBC, UA: >100 /HPF — ABNORMAL HIGH (ref 0–5)
pH, UA: 6 (ref 5.0–8.0)

## 2016-09-13 LAB — BLOOD CULTURE-PERIPHERAL
Culture Result: NO GROWTH
Culture Result: NO GROWTH

## 2016-09-13 LAB — DIFFERENTIAL
Basophils Absolute: 50 /uL (ref 0–200)
Basophils Relative: 0.3 % (ref 0.0–1.0)
Eosinophils Absolute: 101 /uL (ref 15–500)
Eosinophils Relative: 0.6 % (ref 0.0–8.0)
Lymphocytes Absolute: 1663 /uL (ref 850–3900)
Lymphocytes Relative: 9.9 % (ref 15.0–45.0)
Monocytes Absolute: 1193 /uL (ref 200–950)
Monocytes Relative: 7.1 % (ref 0.0–12.0)
Neutrophils Absolute: 13793 /uL (ref 1500–7800)
Neutrophils Relative: 82.1 % (ref 40.0–80.0)

## 2016-09-13 LAB — URINE CULTURE, SPECIAL POPULATION: Culture Result: <1000

## 2016-09-13 LAB — LIPASE: Lipase: 11 U/L (ref 4–82)

## 2016-09-13 MED ORDER — morPHINE injection 4 mg
4 | Freq: Once | INTRAVENOUS | Status: AC
Start: 2016-09-13 — End: 2016-09-13
  Administered 2016-09-13: 20:00:00 4 mg via INTRAVENOUS

## 2016-09-13 MED ORDER — ondansetron (ZOFRAN) injection 4 mg
4 | Freq: Once | INTRAMUSCULAR | Status: AC
Start: 2016-09-13 — End: 2016-09-13
  Administered 2016-09-13: 14:00:00 4 mg via INTRAVENOUS

## 2016-09-13 MED ORDER — tamsulosin (FLOMAX) capsule 0.4 mg
0.4 | Freq: Every day | ORAL | Status: AC
Start: 2016-09-13 — End: 2016-09-17
  Administered 2016-09-14 – 2016-09-17 (×4): 0.4 mg via ORAL

## 2016-09-13 MED ORDER — ondansetron (ZOFRAN) tablet 4 mg
4 | Freq: Three times a day (TID) | ORAL | Status: AC | PRN
Start: 2016-09-13 — End: 2016-09-17
  Administered 2016-09-16: 19:00:00 4 mg via ORAL

## 2016-09-13 MED ORDER — ondansetron (ZOFRAN) injection 4 mg
4 | Freq: Three times a day (TID) | INTRAMUSCULAR | Status: AC | PRN
Start: 2016-09-13 — End: 2016-09-17
  Administered 2016-09-15: 17:00:00 4 mg via INTRAVENOUS

## 2016-09-13 MED ORDER — mometasone-formoterol (DULERA HFA) 100-5 mcg/actuation inhaler 2 puff
100-5 | Freq: Two times a day (BID) | RESPIRATORY_TRACT | Status: AC
Start: 2016-09-13 — End: 2016-09-17
  Administered 2016-09-14 – 2016-09-17 (×8): 2 via RESPIRATORY_TRACT

## 2016-09-13 MED ORDER — ALPRAZolam (XANAX) tablet 1 mg
0.5 | Freq: Two times a day (BID) | ORAL | Status: AC | PRN
Start: 2016-09-13 — End: 2016-09-14
  Administered 2016-09-14 (×2): 1 mg via ORAL

## 2016-09-13 MED ORDER — magnesium hydroxide (MILK OF MAGNESIA) 2,400 mg/10 mL oral suspension 10 mL
2400 | Freq: Every day | ORAL | Status: AC | PRN
Start: 2016-09-13 — End: 2016-09-17
  Administered 2016-09-15 – 2016-09-16 (×2): 10 mL via ORAL

## 2016-09-13 MED ORDER — cefTRIAXone (ROCEPHIN) 1 g in sodium chloride 0.9% 100 mL ADDaptor IVPB
Freq: Once | INTRAVENOUS | Status: AC
Start: 2016-09-13 — End: 2016-09-13
  Administered 2016-09-13: 18:00:00 1 g via INTRAVENOUS

## 2016-09-13 MED ORDER — acetaminophen (TYLENOL) tablet 650 mg
325 | ORAL | Status: AC | PRN
Start: 2016-09-13 — End: 2016-09-17
  Administered 2016-09-16: 02:00:00 650 mg via ORAL

## 2016-09-13 MED ORDER — heparin (porcine) injection 5,000 Units
5000 | Freq: Three times a day (TID) | INTRAMUSCULAR | Status: AC
Start: 2016-09-13 — End: 2016-09-17
  Administered 2016-09-14 – 2016-09-16 (×2): 5000 [IU] via SUBCUTANEOUS

## 2016-09-13 MED ORDER — albuterol (PROVENTIL) nebulizer solution 2.5 mg
2.5 | RESPIRATORY_TRACT | Status: AC | PRN
Start: 2016-09-13 — End: 2016-09-17

## 2016-09-13 MED ORDER — albuterol (PROVENTIL;VENTOLIN;PROAIR) inhaler 2 puff
90 | RESPIRATORY_TRACT | Status: AC | PRN
Start: 2016-09-13 — End: 2016-09-17

## 2016-09-13 MED ORDER — ibuprofen (ADVIL,MOTRIN) tablet 400 mg
400 | Freq: Four times a day (QID) | ORAL | Status: AC | PRN
Start: 2016-09-13 — End: 2016-09-17

## 2016-09-13 MED ORDER — cefTRIAXone (ROCEPHIN) 1 g in sodium chloride 0.9% 100 mL ADDaptor IVPB
INTRAVENOUS | Status: AC
Start: 2016-09-13 — End: 2016-09-13

## 2016-09-13 MED ORDER — oxyCODONE (ROXICODONE) immediate release tablet 10 mg
5 | Freq: Once | ORAL | Status: AC
Start: 2016-09-13 — End: 2016-09-13
  Administered 2016-09-13: 18:00:00 10 mg via ORAL

## 2016-09-13 MED ORDER — oxyCODONE (ROXICODONE) immediate release tablet 10 mg
5 | ORAL | Status: AC | PRN
Start: 2016-09-13 — End: 2016-09-17
  Administered 2016-09-14 – 2016-09-17 (×15): 10 mg via ORAL

## 2016-09-13 MED ORDER — levoFLOXacin (LEVAQUIN) 500 mg in D5W 100 mL IVPB
500 | INTRAVENOUS | Status: AC
Start: 2016-09-13 — End: 2016-09-17
  Administered 2016-09-13 – 2016-09-16 (×4): 500 mg via INTRAVENOUS

## 2016-09-13 MED ORDER — morPHINE injection 4 mg
4 | Freq: Once | INTRAVENOUS | Status: AC
Start: 2016-09-13 — End: 2016-09-13
  Administered 2016-09-13: 14:00:00 4 mg via INTRAVENOUS

## 2016-09-13 MED ORDER — sodium chloride 0.9 % infusion
INTRAVENOUS | Status: AC
Start: 2016-09-13 — End: 2016-09-17
  Administered 2016-09-13 – 2016-09-17 (×9): 100 mL/h via INTRAVENOUS

## 2016-09-13 MED ORDER — umeclidinium (INCRUSE ELLIPTA) powder for inhalation DsDv 62.5 mcg
62.5 | Freq: Every day | RESPIRATORY_TRACT | Status: AC
Start: 2016-09-13 — End: 2016-09-17
  Administered 2016-09-14 – 2016-09-17 (×4): 62.5 ug via RESPIRATORY_TRACT

## 2016-09-13 MED ORDER — oxyCODONE (ROXICODONE) immediate release tablet 5 mg
5 | ORAL | Status: AC | PRN
Start: 2016-09-13 — End: 2016-09-17

## 2016-09-13 MED FILL — OXYCODONE 5 MG TABLET: 5 5 MG | ORAL | Qty: 2

## 2016-09-13 MED FILL — DULERA 100 MCG-5 MCG/ACTUATION HFA AEROSOL INHALER: 100-5 100-5 mcg/actuation | RESPIRATORY_TRACT | Qty: 8.8

## 2016-09-13 MED FILL — SODIUM CHLORIDE 0.9 % INTRAVENOUS SOLUTION: 100.00 100.00 mL/hr | INTRAVENOUS | Qty: 1000

## 2016-09-13 MED FILL — LEVOFLOXACIN 500 MG/100 ML IN 5 % DEXTROSE INTRAVENOUS PIGGYBACK: 500 500 mg/100 mL | INTRAVENOUS | Qty: 100

## 2016-09-13 MED FILL — HEPARIN (PORCINE) 5,000 UNIT/ML INJECTION SOLUTION: 5000 5,000 unit/mL | INTRAMUSCULAR | Qty: 1

## 2016-09-13 MED FILL — SODIUM CHLORIDE 0.9 % INTRAVENOUS PIGGYBACK: 1.00 1.00 g | INTRAVENOUS | Qty: 100

## 2016-09-13 MED FILL — CEFTRIAXONE 1 GRAM SOLUTION FOR INJECTION: 1 1 gram | INTRAMUSCULAR | Qty: 1

## 2016-09-13 MED FILL — ALPRAZOLAM 0.5 MG TABLET: 0.5 0.5 MG | ORAL | Qty: 2

## 2016-09-13 MED FILL — MORPHINE 4 MG/ML INTRAVENOUS SOLUTION: 4 4 mg/mL | INTRAVENOUS | Qty: 1

## 2016-09-13 MED FILL — ONDANSETRON HCL (PF) 4 MG/2 ML INJECTION SOLUTION: 4 4 mg/2 mL | INTRAMUSCULAR | Qty: 2

## 2016-09-13 MED FILL — INCRUSE ELLIPTA 62.5 MCG/ACTUATION POWDER FOR INHALATION: 62.5 62.5 mcg/actuation | RESPIRATORY_TRACT | Qty: 7

## 2016-09-13 NOTE — Unmapped (Signed)
Pt c/o right testicular swelling /pain x 1 week

## 2016-09-13 NOTE — Unmapped (Signed)
Pt taken to xray at this time via stretcher.

## 2016-09-13 NOTE — Unmapped (Signed)
Pt. Bladder scanned around 1945 after attempting to void, which was unsuccessful. Bladder scanner showed >999 mL in the bladder. RN used straight catheterization to empty 1000 mL from the bladder. Post void bladder scan still read 503 mL. RN contacted urologist, Dr. Vickii Penna regarding bladder scan results. MD instructed RN to place a foley catheter. Foley catheter placed (16 french, 10 mL balloon), large urine volume returned. Oncoming RN notified. Will continue to monitor.

## 2016-09-13 NOTE — Unmapped (Signed)
Patient IV in right forearm infiltrated.  Was removed by this nurse at this time and warm compress applied.  Pt has no c/o pain or discomfort at this time.

## 2016-09-13 NOTE — Unmapped (Signed)
[facility]   History and Physical    Patient: Derrick Garner  ZOX:[id number]      Date of Birth: [birth year]  Age: 66 y.o.  Sex: male   PCP:  Donzetta Starch, MD Room/Bed:   Location: [facility]       09/13/2016 7:01 PM    Chief complaint:       Chief Complaint   Patient presents with   ??? Groin Swelling       History of Present Illness:   Derrick Garner is a 65 y.o. male with past medical history of Severe COPD on home oxygen, anxiety, history of BPH and recurrent UTI presented with right sided testicular edema and tenderness.  Patient states the symptoms started 3-4 days ago and has been worsening since then. She denies any drainage, fever or chills.    REVIEW OF SYSTEMS:    Constitutional:  Negative for fever,chills or night sweats  ENT:  Negative for rhinorrhea, epistaxis, hoarseness, sore throat.  Respiratory:   Positive for exertional shortness of breath  Cardiovascular:   Negative for  chest pain, palpitations   Gastrointestinal:  Negative for nausea, vomiting, diarrhea  Genitourinary:  Positive for right-sided testicular edema and urinary symptoms  Hematologic/Lymphatic:  Negative for  bleeding tendency, easy bruising  Musculoskeletal:  Negative for myalgias,bone pain  Neurologic:  Negative for  confusion,dysarthria.  Skin:  Negative for itching,rash  Psychiatric:  Negative for depression,anxiety, agitation.  Endocrine:  Negative for polydipsia,polyuria,heat /cold intolerance.  Remainder of review of systems reviewed and negative.    Past Medical History:     Past Medical History:   Diagnosis Date   ??? Anxiety    ??? 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 History of alcohol abuse    ??? Hypoxemia requiring supplemental oxygen    ??? Tobacco abuse          Past Surgical History:    has a past surgical history that includes Colonoscopy w/ biopsies and polypectomy.       Medications:     No current facility-administered medications on file prior to encounter.       Current Outpatient Prescriptions on File Prior to Encounter   Medication Sig Dispense Refill   ??? albuterol (PROVENTIL) 2.5 mg /3 mL (0.083 %) nebulizer solution Inhale 2.5 mg by nebulization every 6 hours as needed.               ??? albuterol (PROVENTIL;VENTOLIN;PROAIR) 90 mcg/actuation inhaler Inhale 2 puffs into the lungs every 4 hours as needed for Wheezing. 1 Inhaler 11   ??? ALPRAZolam (XANAX) 1 MG tablet Take 1 mg by mouth 2 times a day.               ??? diphenhydrAMINE (BENADRYL) 25 mg capsule Take 25-50 mg by mouth if needed.               ??? fluticasone-salmeterol (ADVAIR) 250-50 mcg/dose diskus inhaler Inhale 2 puffs into the lungs 2 times a day.               ??? ibuprofen (ADVIL) 200 MG tablet Take 400 mg by mouth if needed.               ??? tiotropium (SPIRIVA) 18 mcg Inhale 1 capsule (18 mcg total) into the lungs daily. 30 capsule 6   ??? [DISCONTINUED] predniSONE (DELTASONE) 20 MG tablet Take by mouth.     ??? [  DISCONTINUED] tamsulosin (FLOMAX) 0.4 mg Cp24 Take 1 capsule (0.4 mg total) by mouth at bedtime. 30 capsule 3         Allergies:   No Known Allergies       Social History:    reports that he quit smoking about 8 months ago. His smoking use included Cigarettes and Cigars. He has a 67.50 pack-year smoking history. He has never used smokeless tobacco. He reports that he has current or past drug history, including Marijuana. He reports that he does not drink alcohol.       Family History:   family history includes COPD in his maternal uncle; Cancer in his father; Lung Cancer in his father.      Physical Exam:   BP 111/69 (BP Location: Right arm, Patient Position: Lying)    Pulse 88    Temp 98.1 ??F (36.7 ??C) (Oral)    Resp 16    Ht 5' 11 (1.803 m)    Wt 128 lb (58.1 kg)    SpO2 97%    BMI 17.85 kg/m??     General appearance:  Appears comfortable. Well nourished  Eyes: Sclera clear, pupils equal  ENT: Moist mucus membranes, no thrush. Trachea midline.  Cardiovascular: Regular rhythm, normal S1, S2. No  murmur, gallop, rub. No edema in lower extremities  Respiratory: Clear to auscultation bilaterally, no wheeze, good inspiratory effort  Gastrointestinal: Abdomen soft, non-tender, not distended, normal bowel sounds  Musculoskeletal: No cyanosis in digits, neck supple  Neurology: Cranial nerves grossly intact. Alert and oriented in time, place and person. No speech or motor deficits  Psychiatry: Appropriate affect. Not agitated  Skin: Warm, dry, normal turgor, no rash      Labs:   CBC:   Lab Results   Component Value Date    WBC 16.8 (H) 09/13/2016    RBC 4.43 09/13/2016    HGB 13.4 09/13/2016    HCT 39.1 09/13/2016    MCV 88.4 09/13/2016    MCH 30.3 09/13/2016    MCHC 34.2 09/13/2016    RDW 12.9 09/13/2016    PLT 426 (H) 09/13/2016    MPV 7.1 (L) 09/13/2016     BMP:    Lab Results   Component Value Date    NA 137 09/13/2016    K 4.5 09/13/2016    CL 98 09/13/2016    CO2 31 09/13/2016    BUN 19 09/13/2016    CREATININE 1.50 (H) 09/13/2016    CALCIUM 9.3 09/13/2016    GLUCOSE 90 09/13/2016       Ultrasound: Enlarged heterogeneous right testicle and epididymis with increased Doppler flow is suspicious for acute epididymoorchitis. Tiny foci of gas within the epididymis may represent early abscess formation.  ??  Normal sonographic appearance of the left testicle.    Problem List     1.   Epididymitis  2.   Acute on chronic respiratory failure  3.   Acute renal failure  4.   Leukocytosis  5.   History of recurrent UTI      Plan:    1. Patient was admitted for Acute epididymitis and has been started on IV antibiotics. Escherichia coli, other coliforms, and Pseudomonas species are more frequent in older men, often in association with obstructive uropathy from benign prostatic hyperplasia. Plan to start on Rocephin, Levaquin and follow the cultures and sensitivities.  Possible change to Oral antibiotic after culture and sensitivities.  2. Patient was started on IV fluids for acute renal failure.  Follow with repeat BMP in  the morning  3. He is on bronchodilators, oxygen for chronic obstructive pulmonary disease  4. He is a DVT and GI prophylaxis    Laniesha Das  09/13/2016 7:01 PM      The patient was placed in inpatient status with the expectation that they will require at least 2 (two) midnights for care and treatment of the clinical conditions identified in the active problem list,  due to the following co-morbidities and medical history  Severe COPD, oxygen dependent and acute epididymitis/orchitis and need for IV antibiotics    Estimated time the beneficiary requires hospitalization:  2-4  Days

## 2016-09-13 NOTE — Unmapped (Signed)
Assessment completed of Derrick Garner with ordered PRN aerosolized medication(s).  Determined that no medication was needed at this time.

## 2016-09-13 NOTE — Unmapped (Signed)
Problem: Safety  Goal: Patient will be injury free during hospitalization  Assess and monitor vitals signs, neurological status including level of consciousness and orientation. Assess patient's risk for falls and implement fall prevention plan of care and interventions per hospital policy.      Ensure arm band on, uncluttered walking paths in room, adequate room lighting, call light and overbed table within reach, bed in low position, wheels locked, side rails up per policy, and non-skid footwear provided.    Outcome: Progressing  Vital signs monitored per policy, neurological status and level of consciousness WNL and monitored per policy, risk for falls assessed with appropriate interventions in place. Arm band on patient, paths clear, adequate lighting, call light and overbed table within reach. Bed in low position with wheels locked and side rails up per policy. Non-skid footwear provided.

## 2016-09-13 NOTE — Unmapped (Signed)
Call placed to lab at this time for blood cultures.

## 2016-09-13 NOTE — Unmapped (Signed)
UROLOGY CONSULT NOTE    Patient: Derrick Garner  Admit Date: 09/13/2016  Consult Date: 09/13/2016  Requesting Physician: No admitting provider for patient encounter.  Room: C26/C26W    Reason for Consult/CC: right testicular pain/epididymitis     HPI: Derrick Garner is a 66 y.o. male with history of BPH s/p TURP 3 years ago with chronic incontinence, recurrent UTIs, elevated PSA (10), COPD on home O2 with chronic dyspnea, presenting to ED with 7-10 days of right testicular pain and tenderness. Patient first noted symptoms over a week ago but waited to come in or call anyone about his symptoms as I guess I'm just stubborn and was trying to wait it out. Pain is currently 10/10, improved with IV pain medication, worse with palpation or movement,  He denies fever/chills, but UA shows +LE and blood and visually appears grossly infected. His WBC is elevated at 16.8 and creatinine at 1.5 above baseline 1.2. He is being admitted for IV antibiotics. He reports feeling better overall with slightly decreased testicular pain since getting pain medication and antibiotics. He is recently moved here from [place].     Medical History:  Past Medical History:   Diagnosis Date   ??? Anxiety    ??? 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 History of alcohol abuse    ??? Hypoxemia requiring supplemental oxygen    ??? Tobacco abuse        Past Surgical History:   Procedure Laterality Date   ??? COLONOSCOPY W/ BIOPSIES AND POLYPECTOMY         Medications:   Outpatient Meds:  Previous Medications    ALBUTEROL (PROVENTIL) 2.5 MG /3 ML (0.083 %) NEBULIZER SOLUTION    Inhale 2.5 mg by nebulization every 6 hours as needed.              ALBUTEROL (PROVENTIL;VENTOLIN;PROAIR) 90 MCG/ACTUATION INHALER    Inhale 2 puffs into the lungs every 4 hours as needed for Wheezing.    ALPRAZOLAM (XANAX) 1 MG TABLET    Take 1 mg by mouth 2 times a day.              ALPRAZOLAM (XANAX) 1 MG TABLET    Take 2 mg by mouth after lunch. Between  1400-1500    DIPHENHYDRAMINE (BENADRYL) 25 MG CAPSULE    Take 25-50 mg by mouth if needed.              FLUTICASONE-SALMETEROL (ADVAIR) 250-50 MCG/DOSE DISKUS INHALER    Inhale 2 puffs into the lungs 2 times a day.              FOOD SUPPLEMT, LACTOSE-REDUCED (ENSURE ORIGINAL) LIQD    Take 1 Can by mouth 3 times a day. vanilla    IBUPROFEN (ADVIL) 200 MG TABLET    Take 400 mg by mouth if needed.              PREDNISONE (DELTASONE) 20 MG TABLET    Take by mouth.    TAMSULOSIN (FLOMAX) 0.4 MG CP24    Take 1 capsule (0.4 mg total) by mouth at bedtime.    TAMSULOSIN (FLOMAX) 0.4 MG CP24    Take 0.4 mg by mouth daily.    TIOTROPIUM (SPIRIVA) 18 MCG    Inhale 1 capsule (18 mcg total) into the lungs daily.       Allergies: No Known Allergies    SH:   Social History   Substance Use Topics   ???  Smoking status: Former Smoker     Packs/day: 1.50     Years: 45.00     Types: Cigarettes, Cigars     Quit date: 12/28/2015   ??? Smokeless tobacco: Never Used   ??? Alcohol use No      Comment: quit 14yrs ago, hx of heavy alcohol abuse prior       FH: Noncontributory.    Review of Systems: A full ROS was performed and negative for constitutional, eyes, ENT, respiratory, cardiovascular, gastrointestinal, endocrine, hematology, neurologic, psychiatric, skin, genito-urinary and musculo-skeletal systems except as otherwise documented in HPI.    Objective:  Vitals:    09/13/16 1409   BP: 130/74   Pulse:    Resp:    Temp:    SpO2: 97%        Physical Examination:  Gen: No apparent distress, conversant    Eyes: Pupils reactive, extraocular motion intact  Head: Normocephalic, atraumatic  ENT: mucous membranes pink and moist  CV: RRR  Chest: No respiratory distress, equal chest rise bilaterally  Abd: Soft, non-tender, non-distended   Ext: Warm and well perfused, pulses intact  Neuro: No focal deficits, alert and oriented x3  GU: circumcised phallus, right testis enlarged, tender, swollen, minimal overlying skin changes, normal left  testis    Labs:  Recent Labs      09/13/16   1017   WBC  16.8*   HGB  13.4   HCT  39.1   PLT  426*     Recent Labs      09/13/16   1017   NA  137   K  4.5   CL  98   CO2  31   BUN  19   CREATININE  1.50*   GLUCOSE  90   CALCIUM  9.3       Imaging:   Exam: US SCROTUM, US DUPLEX ABD-PELVIS-SCROTUM COMPLETE dated 09/13/2016 9:53 AM EDT  ??  Indication:  Swelling;  pain   ??  Comparison:  None  ??  Technique:  Grayscale imaging acquisition was performed for evaluation of the scrotum and contents with limited color and spectral (duplex) Doppler evaluation of the testicles.  ??  Findings:  ??  The right testicle measures 3.9 x 2.3 x 3.1 cm in size.  ??  The left testicle measures 4.2 x 1.6 x 2.4 cm in size.  ??  The left testicle demonstrates normal size and echogenicity. Small left epididymal cyst.   ??  Right testicle is asymmetrically enlarged and heterogeneous in echogenicity. There is increased Doppler flow to the right testicle. The right epididymis is markedly enlarged with increased Doppler flow. There are a few punctate hyperechoic foci within the epididymis.  ??  ??  IMPRESSION:   ??  Enlarged heterogeneous right testicle and epididymis with increased Doppler flow is suspicious for acute epididymoorchitis. Tiny foci of gas within the epididymis may represent early abscess formation.  ??  Normal sonographic appearance of the left testicle.  ??  Approved by Lafayette Dragon, MD on 09/13/2016 12:02 PM EDT  ??  I have personally reviewed the images and I agree with this report.    Diagnosis: right epididymoorchitis     Assessment:  Derrick Garner is a 66 y.o. male with right epididymoorchitis     Plan/Recommendations:  1. Agree with IV Abx, switch to PO when cultures return and white blood count trends down. Discussed with patient that the pain should improve first, swelling may take weeks to months  to improve. If his white blood count does not respond to culture specific antibiotics or his clinical picture does not improve, will need  to reimage testis - has a small focus of gas in epididymis, discussed risk of losing his testis with patient who understood and was ok with possibility of removal.  2. Recommend PVR to ensure complete bladder emptying. Has recurrent UTIs, history of TURP, would like to ensure he is not retaining as a source of his recurrent infections.       Gillis Ends, MD  09/13/2016

## 2016-09-13 NOTE — Unmapped (Signed)
ED Attending Attestation Note    Date of service:  09/13/2016    This patient was seen by the advanced practice provider.  I have seen and examined the patient, agree with the workup, evaluation, management and diagnosis.  The care plan has been discussed and I concur.      My assessment reveals a 66 y.o. male who complains of right hemiscrotum pain.  His urine is grossly cloudy.  He has marked swelling to the right hemiscrotum.

## 2016-09-13 NOTE — Unmapped (Signed)
Bed: C26W  Expected date: 09/13/16  Expected time:   Means of arrival:   Comments:  [facility] - 66 yr male - groin swelling /pain  vss  Hx chf /copd

## 2016-09-13 NOTE — Unmapped (Signed)
[facility]  Case Management/Social Work Department  Discharge Planning Assessment  Patient Information   Current Mental Status: alert and oriented  Patient lives with: brother and his family  Type of Home: single family home  Number of Steps: 1  Level of Activity Prior to Admission: independent           Current Level of Activity: with assist  DME Available at Home: no  PCP: Donzetta Starch, MD  Home Pharmacy:          [facility] [address], [place] - [address]  [address]  [place] [address]  Phone: [phone]             Issues related to obtaining prescriptions: n/a  Coumadin follow up: n/a  Transportation at discharge: family    Support Systems   Contact person/Caregiver:        Contact Person: Deshon Hsiao       Phone: [phone]       Relationship to the patient: daughter       Permission to contact:  yes  Name of POA/Guardian: Davied Nocito       Verified: yes  24/7 Supervision/Assistance Available at D/C if needed: yes  Barriers/Significant Issues that may affect discharge or follow up care: n/a    Community Resources    Rehab (Current/Prior): n/a  Home Health/Home Infusion (Current/Prior): n/a  Health visitor (Current/Prior): n/a  Oxygen/Bipap/CPAP/Hand Engineer, production Prior to admission: Oxygen and nebulizer       Liter flow: 2L       Provider Name: patient usure       Phone: patient unsure       Fax: patient unsure  Outpatient Dialysis Services: no         Other Pertinent Information       Discharge Plan   Met with patient to initiate discussion regarding discharge planning. Introduced self and role of case management/social work and provided Tour manager.    Patient lives with his brother and his family in a single family home. He has oxygen and a nebulizer at home. He is unsure of the name of the oxygen company. No home health prior to admission. Patient states he was independent. CM will follow for any needs on discharge. For now, no home health needs  identified.    CM/SW will continue to follow and remain available for continued discharge planning.    Patient/Family aware and taking part in the discharge plan.  Patient/family were offered a post-acute provider list as applicable to the discharge plan and insurance provider.  Patient/family were given the freedom to choose providers and financial interest(s) were disclosed as appropriate.    Odette Fraction, RN  Case Manager  [phone]

## 2016-09-13 NOTE — ED Notes (Signed)
Nikki at bedside at this time to update patient on plan of care.

## 2016-09-13 NOTE — Unmapped (Signed)
Bed: C26W  Expected date:   Expected time:   Means of arrival:   Comments:

## 2016-09-13 NOTE — Unmapped (Signed)
Pt returned from xray.

## 2016-09-13 NOTE — Unmapped (Signed)
Problem: Inadequate Gas Exchange  Goal: Patient is adequately oxygenated and ventilation is improved  Assess and monitor vital signs, oxygen saturation, respiratory status to include rate, depth, effort, and lung sounds, mental status, cyanosis, and labs (ABG's).  Monitor effects of medications that may sedate the patient.  Collaborate with respiratory therapy to administer medications and treatments.  Outcome: Progressing

## 2016-09-13 NOTE — Unmapped (Signed)
Admission completed per policy.  Patient has been oriented to the room and patient is stable at this time. Patient is resting quietly in bed.  Call light within reach safety protocols are in place .Information was provided by patient.     Pam Johncarlos Holtsclaw RN BSN  [phone]

## 2016-09-13 NOTE — Unmapped (Signed)
Problem: Discharge Planning  Goal: Identify discharge needs    Intervention: Discharge planning  6/18 progressing  From home independent. Plans to return. No home health needs identified at present  [place], RN  Case Manager  [phone]

## 2016-09-13 NOTE — Unmapped (Signed)
Medication Reconciliation  [facility]    Patient Name: Derrick Garner [birth year]    Medication reconciliation has been completed by: Pharmacy Technician    Source(s) of information:  Patient and Surescripts     Primary Care Physician: Donzetta Starch, MD    Pharmacy:   [facility] [address], [place] - [address]  [address]  [place] [address]  Phone: [phone]       No Known Allergies    Prior to Admission medications taking for visit date 09/13/16   Medication Sig Taking? Authorizing Provider   albuterol (PROVENTIL) 2.5 mg /3 mL (0.083 %) nebulizer solution Inhale 2.5 mg by nebulization every 6 hours as needed.           Yes Historical Provider, MD   albuterol (PROVENTIL;VENTOLIN;PROAIR) 90 mcg/actuation inhaler Inhale 2 puffs into the lungs every 4 hours as needed for Wheezing. Yes Runell Gess Chikwa, CNP   ALPRAZolam Prudy Feeler) 1 MG tablet Take 1 mg by mouth 2 times a day.           Yes Historical Provider, MD   ALPRAZolam Prudy Feeler) 1 MG tablet Take 2 mg by mouth after lunch. Between 1400-1500 Yes Historical Provider, MD   diphenhydrAMINE (BENADRYL) 25 mg capsule Take 25-50 mg by mouth if needed.           Yes Historical Provider, MD   fluticasone-salmeterol (ADVAIR) 250-50 mcg/dose diskus inhaler Inhale 2 puffs into the lungs 2 times a day.           Yes Historical Provider, MD   food supplemt, lactose-reduced (ENSURE ORIGINAL) Liqd Take 1 Can by mouth 3 times a day. vanilla Yes Historical Provider, MD   ibuprofen (ADVIL) 200 MG tablet Take 400 mg by mouth if needed.           Yes Historical Provider, MD   tamsulosin (FLOMAX) 0.4 mg Cp24 Take 0.4 mg by mouth daily. Yes Historical Provider, MD   tiotropium (SPIRIVA) 18 mcg Inhale 1 capsule (18 mcg total) into the lungs daily. Yes Ivery Quale, MD         Medications flagged for removal (include reason, ex. noncompliance, medication cost, therapy complete etc.):    ?? Prednisone removed-Pt reports not taking.  ?? Tamsulosin  removed-replaced with updated sig.    Other notes:         To my knowledge the above medication reconciliation is accurate as of 09/13/2016 1:51 PM.        MARIA BENNETT, CPhT  09/13/2016 1:51 PM  [phone]

## 2016-09-13 NOTE — Unmapped (Signed)
To ultrasound per stretcher

## 2016-09-13 NOTE — Unmapped (Signed)
[facility]  eMERGENCY dEPARTMENT eNCOUnter    DATE OF SERVICE: 09/13/2016      Chief Complaint   Groin Swelling      Nursing Notes, Past Medical Hx, Past Surgical Hx, Social Hx, Allergies, and Family Hx were all reviewed and agreed with, or any disagreements were addressed in the HPI.    History of Present Illness       Derrick Garner is a 66 y.o. male with a past medical history of COPD, anxiety; He presents to the emergency room with a complaint of right-sided groin swelling/scrotal swelling for the past week to week and a half.  Patient states it suddenly became swollen, has been progressively more painful.  Rating into his abdomen, as well as his right back.  Patient concern for possible kidney abnormality.  He does feel he's having difficulty urinating, denies any hematuria or dysuria.  Denies fevers, chills, sweats, however has had some increased fatigue with ambulation.  He is on 2 L nasal cannula oxygen at baseline at home.Currently rates pain at a 9 out of 10, states that it's causing his right leg to hurt as well, as well as his right back.  Denies paresthesias.    Past Medical History:   Diagnosis Date   ??? Anxiety    ??? 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 History of alcohol abuse    ??? Hypoxemia requiring supplemental oxygen    ??? Tobacco abuse        Past Surgical History:   Procedure Laterality Date   ??? COLONOSCOPY W/ BIOPSIES AND POLYPECTOMY         Social History   Substance Use Topics   ??? Smoking status: Former Smoker     Packs/day: 1.50     Years: 45.00     Types: Cigarettes, Cigars     Quit date: 12/28/2015   ??? Smokeless tobacco: Never Used   ??? Alcohol use No      Comment: quit 52yrs ago, hx of heavy alcohol abuse prior         Review of Systems     Review of Systems   Constitutional: Positive for chills and malaise/fatigue.   HENT: Negative.    Respiratory: Negative.    Cardiovascular: Negative.    Gastrointestinal: Positive for abdominal pain and nausea. Negative  for blood in stool, constipation, diarrhea and vomiting.   Genitourinary: Positive for flank pain (right). Negative for dysuria and hematuria.        Urinary hesitancy, right scrotal swelling   Musculoskeletal: Positive for back pain.   Skin: Negative.    Neurological: Negative.    Endo/Heme/Allergies: Does not bruise/bleed easily.   Psychiatric/Behavioral: Negative.          Medications     Previous Medications    ALBUTEROL (PROVENTIL) 2.5 MG /3 ML (0.083 %) NEBULIZER SOLUTION    daily as needed.    ALBUTEROL (PROVENTIL;VENTOLIN;PROAIR) 90 MCG/ACTUATION INHALER    Inhale 2 puffs into the lungs every 4 hours as needed for Wheezing.    ALPRAZOLAM (XANAX) 1 MG TABLET    Take 1 mg by mouth 4 times a day.              DIPHENHYDRAMINE HCL (BENADRYL ORAL)    Take by mouth if needed.              FLUTICASONE-SALMETEROL (ADVAIR) 250-50 MCG/DOSE DISKUS INHALER    Inhale into the lungs.    IBUPROFEN (  ADVIL ORAL)    Take by mouth if needed.              PREDNISONE (DELTASONE) 20 MG TABLET    Take by mouth.    TAMSULOSIN (FLOMAX) 0.4 MG CP24    Take 1 capsule (0.4 mg total) by mouth at bedtime.    TIOTROPIUM (SPIRIVA) 18 MCG    Inhale 1 capsule (18 mcg total) into the lungs daily.       Allergies     He has No Known Allergies.    Physical Exam      INITIAL VITALS: BP 132/77    Pulse 97    Temp 98.8 ??F (37.1 ??C)    Resp 18    Ht 5' 11 (1.803 m)    Wt 128 lb (58.1 kg)    SpO2 97%    BMI 17.85 kg/m??    Physical Exam   Nursing note and vitals reviewed.  Constitutional: He is oriented to person, place, and time.   Cachectic Caucasian male, no acute distress   Abdominal: Soft. Bowel sounds are normal. He exhibits no distension. There is tenderness (right-sided).   Genitourinary:   Genitourinary Comments: Right scrotal edema, without significant erythema noted, significant tenderness to palpation, positive cremasteric reflex, no fluctuance noted however exam limited due to pain.  No penile swelling noted.  No rashes or lesions.    Musculoskeletal: Normal range of motion.   Neurological: He is alert and oriented to person, place, and time.   Skin: Skin is warm and dry.   Psychiatric: He has a normal mood and affect.       Diagnostic Results       RADIOLOGY:  US Scrotum   Final Result   IMPRESSION:       Enlarged heterogeneous right testicle and epididymis with increased Doppler flow is suspicious for acute epididymoorchitis. Tiny foci of gas within the epididymis may represent early abscess formation.      Normal sonographic appearance of the left testicle.      Approved by Lafayette Dragon, MD on 09/13/2016 12:02 PM EDT      I have personally reviewed the images and I agree with this report.      Report Verified by: Fulton Reek, M.D. at 09/13/2016 12:53 PM EDT      US Duplex Abd-Pel-Scrotum Comp   Final Result   IMPRESSION:       Enlarged heterogeneous right testicle and epididymis with increased Doppler flow is suspicious for acute epididymoorchitis. Tiny foci of gas within the epididymis may represent early abscess formation.      Normal sonographic appearance of the left testicle.      Approved by Lafayette Dragon, MD on 09/13/2016 12:02 PM EDT      I have personally reviewed the images and I agree with this report.      Report Verified by: Fulton Reek, M.D. at 09/13/2016 12:53 PM EDT      X-ray Chest PA and Lateral   Final Result   IMPRESSION:      Extensive emphysema with focal scarring in the right middle lobe, overall similar to the prior study.      Report Verified by: Rick Duff, M.D. at 09/13/2016 11:48 AM EDT            LABS:   Labs Reviewed   BASIC METABOLIC PANEL - Abnormal; Notable for the following:        Result Value    Creatinine 1.50 (*)  All other components within normal limits    Narrative:     As of 05/28/2014 the estimated GFR is calculated from serum creatinine using the Chronic Kidney Disease  Epidemiology Collaboration (CKD-EPI) equation in patients 18 years and older.  The reference range is   >60 mL/min/1.61m2.   eGFR values greater than 90 will be reported as >60mL/min/1.73m2.  Reference: Cherie Dark AS, Suella Grove [facility], Stefano Gaul AF, 3rd, Feldman HI, et. al.  A new equation to estimate glomerular filtration rate.  Ann Intern Med. 2009:150(9):604-12   CBC - Abnormal; Notable for the following:     WBC 16.8 (*)     Platelets 426 (*)     MPV 7.1 (*)     All other components within normal limits   DIFFERENTIAL - Abnormal; Notable for the following:     Neutrophils Relative 82.1 (*)     Lymphocytes Relative 9.9 (*)     Neutrophils Absolute 13,793 (*)     Monocytes Absolute 1,193 (*)     All other components within normal limits   HEPATIC FUNCTION PANEL - Abnormal; Notable for the following:     Albumin 3.4 (*)     All other components within normal limits   URINALYSIS W/RFL TO MICROSCOP, NO CULT - Abnormal; Notable for the following:     Clarity, UA Turbid (*)     Protein, UA 100 (*)     Blood, UA Small (*)     Leukocytes, UA Large (*)     RBC, UA 22 (*)     WBC, UA >100 (*)     WBC Clumps, UA Many (*)     Bacteria, UA Few (*)     All other components within normal limits   LIPASE       RECENT VITALS:  BP: 132/77, Temp: 98.8 ??F (37.1 ??C), Heart Rate: 97, Resp: 18     Medications - No data to display      ED Course/Medical Decision Making    Martel Galvan is a 66 y.o. male who presented to the emergency department with a chief complaint as described in the history of present illness. A focused history and physical were performed by me in collaboration with the Attending Physician, No att. providers found. All care plans were discussed and agreed upon.     Patient presents to the emergency room with a complaint of right scrotal swelling that has been ongoing over a week.  Significant swollen scrotum noted on exam, as well as some right-sided abdominal pain.  Associated with some urinary hesitancy.  Patient appears ill at baseline.  He is hemodynamically stable, afebrile at this time.  No evidence of sepsis.  Laboratory  evaluation including CBC, EP 1, LFTs, lipase, urinalysis were obtained; urinalysis notable for greater than 100 WBCs, with white blood cell clumping noted.  Leukocytosis of 16,000.  Chest x-ray was obtained given the patient's complaint of increased shortness of breath, this is unremarkable.  Pulmonary exam here stable with no concern for COPD exacerbation.  Scrotal ultrasound obtained, which does show evidence of acute epididymoorchitis.  Patient will be started on rocephin for treatment of UTI and epididymoorchitis and will be admitted to the hospitalist for further management.  Patient admitted to [facility] hospitalist in stable condition.    Patient was given adequate time to ask questions and does verbalize agreement with the treatment plan.  Disposition     CLINICAL IMPRESSION:  1. Epididymoorchitis    2. Cystitis  DISPOSITION:  Admitted in stable condition             Verita Lamb, NP  09/13/16 1744

## 2016-09-14 LAB — BASIC METABOLIC PANEL
Anion Gap: 6 mmol/L (ref 3–16)
BUN: 18 mg/dL (ref 7–25)
CO2: 29 mmol/L (ref 21–33)
Calcium: 8.5 mg/dL (ref 8.6–10.3)
Chloride: 100 mmol/L (ref 98–110)
Creatinine: 1.19 mg/dL (ref 0.60–1.30)
Glucose: 112 mg/dL (ref 70–100)
Osmolality, Calculated: 283 mOsm/kg (ref 278–305)
Potassium: 4.9 mmol/L (ref 3.5–5.3)
Sodium: 135 mmol/L (ref 133–146)
eGFR AA CKD-EPI: 73 See note.
eGFR NONAA CKD-EPI: 63 See note.

## 2016-09-14 LAB — DIFFERENTIAL
Bands Absolute: 2640 /uL (ref 0–750)
Bands Relative: 12 % (ref 0.0–9.0)
Eosinophils Absolute: 220 /uL (ref 15–500)
Eosinophils Relative: 1 % (ref 0.0–8.0)
Lymphocytes Absolute: 2420 /uL (ref 850–3900)
Lymphocytes Relative: 11 % (ref 15.0–45.0)
Monocytes Absolute: 1100 /uL (ref 200–950)
Monocytes Relative: 5 % (ref 0.0–12.0)
Neutrophils Absolute: 15620 /uL (ref 1500–7800)
Neutrophils Relative: 71 % (ref 40.0–80.0)

## 2016-09-14 LAB — CBC
Hematocrit: 36.6 % (ref 38.5–50.0)
Hemoglobin: 12.5 g/dL (ref 13.2–17.1)
MCH: 30.2 pg (ref 27.0–33.0)
MCHC: 34.3 g/dL (ref 32.0–36.0)
MCV: 88.1 fL (ref 80.0–100.0)
MPV: 6.9 fL (ref 7.5–11.5)
Platelets: 370 10*3/uL (ref 140–400)
RBC: 4.16 10*6/uL (ref 4.20–5.80)
RDW: 13.1 % (ref 11.0–15.0)
WBC: 22 10*3/uL (ref 3.8–10.8)

## 2016-09-14 LAB — LACTIC ACID: Lactate: 0.5 mmol/L (ref 0.5–2.2)

## 2016-09-14 MED ORDER — ALPRAZolam (XANAX) tablet 1-2 mg
0.5 | Freq: Two times a day (BID) | ORAL | Status: AC | PRN
Start: 2016-09-14 — End: 2016-09-14

## 2016-09-14 MED ORDER — ALPRAZolam (XANAX) tablet 2 mg
0.5 | Freq: Two times a day (BID) | ORAL | Status: AC | PRN
Start: 2016-09-14 — End: 2016-09-17
  Administered 2016-09-14 – 2016-09-16 (×3): 2 mg via ORAL

## 2016-09-14 MED ORDER — ALPRAZolam (XANAX) tablet 1 mg
0.5 | Freq: Two times a day (BID) | ORAL | Status: AC | PRN
Start: 2016-09-14 — End: 2016-09-17
  Administered 2016-09-15 – 2016-09-17 (×3): 1 mg via ORAL

## 2016-09-14 MED FILL — SODIUM CHLORIDE 0.9 % INTRAVENOUS SOLUTION: 100.00 100.00 mL/hr | INTRAVENOUS | Qty: 1000

## 2016-09-14 MED FILL — OXYCODONE 5 MG TABLET: 5 5 MG | ORAL | Qty: 2

## 2016-09-14 MED FILL — HEPARIN (PORCINE) 5,000 UNIT/ML INJECTION SOLUTION: 5000 5,000 unit/mL | INTRAMUSCULAR | Qty: 1

## 2016-09-14 MED FILL — ALPRAZOLAM 1 MG TABLET: 1 1 MG | ORAL | Qty: 2

## 2016-09-14 MED FILL — LEVOFLOXACIN 500 MG/100 ML IN 5 % DEXTROSE INTRAVENOUS PIGGYBACK: 500 500 mg/100 mL | INTRAVENOUS | Qty: 100

## 2016-09-14 MED FILL — ALPRAZOLAM 0.5 MG TABLET: 0.5 0.5 MG | ORAL | Qty: 2

## 2016-09-14 MED FILL — TAMSULOSIN 0.4 MG CAPSULE: 0.4 0.4 mg | ORAL | Qty: 1

## 2016-09-14 MED FILL — ALPRAZOLAM 0.5 MG TABLET: 0.5 0.5 MG | ORAL | Qty: 4

## 2016-09-14 NOTE — Unmapped (Signed)
Urology Progress Note    Name: Derrick Garner  Admit Date: 09/13/2016  OR Date:     Subjective:   Patient seen and examined this morning. No acute events over night. VSS, Afebrile. Pt with elevated post void residuals last night. Pt was straight cathed by nursing then he latter had a foley placed. Cultures still pending. Foley in place with hematuria secondary to trauma from cathing. Pt notes that in the past he did not take Flomax on a regular basis. Pt notes his right teste pain ans swelling has improved some. WBC continues to rise.     Objective:   Vitals:  Temp:  [98 ??F (36.7 ??C)-98.1 ??F (36.7 ??C)] 98.1 ??F (36.7 ??C)  Heart Rate:  [88-105] 100  Resp:  [16-17] 16  BP: (106-130)/(62-74) 106/62    Intake/Output:    Date 09/13/16 0700 - 09/14/16 0659 09/14/16 0700 - 09/15/16 0659   Shift 0700-1459 1500-2259 2300-0659 24 Hour Total 0700-1459 1500-2259 2300-0659 24 Hour Total   I  N  T  A  K  E   P.O.     320   320      P.O.     320   320    I.V.  (mL/kg)     1656.7  (28.5)   1656.7  (28.5)      Volume (mL) (sodium chloride 0.9 % infusion)     1656.7   1656.7    Shift Total  (mL/kg)     1976.7  (34)   1976.7  (34)   O  U  T  P  U  T   Urine  (mL/kg/hr) 75  (0.2) 1500  (3.2) 600  (1.3) 2175  (1.6)          Urine 75   75          Post Void Cath Residual (mL)  500  500          Intermittent/Straight Cath (mL)  1000  1000          Output (mL) (IUC (Foley) 16 Fr.)   600 600        Emesis/NG output     0   0      Emesis     0   0    Stool     0   0      Stool     0   0    Shift Total  (mL/kg) 75  (1.3) 1500  (25.8) 600  (10.3) 2175  (37.5) 0  (0)   0  (0)   Weight (kg) 58.1 58.1 58.1 58.1 58.1 58.1 58.1 58.1       Physical Exam:  Gen: Alert and oriented x3, no acute distress  HEENT: NCAT  CV: Regular rate and rhythm  Resp: No respiratory distress  Abd: Soft, non-distended, non-tender  Ext: Warm and well perfused  GU: Foley in place with gross hematuria, Left teste wnl, Right teste swollen and tender to touch     Labs:  Recent  Labs      09/13/16   1017  09/14/16   0603   WBC  16.8*  22.0*   HGB  13.4  12.5*   HCT  39.1  36.6*   PLT  426*  370     Recent Labs      09/13/16   1017  09/14/16   0603   NA  137  135   K  4.5  4.9   CL  98  100   CO2  31  29   BUN  19  18   CREATININE  1.50*  1.19   GLUCOSE  90  112*   CALCIUM  9.3  8.5*       Current Medications:  Scheduled Meds:   ??? heparin (porcine)  5,000 Units Subcutaneous 3 times per day   ??? levoFLOXacin  500 mg Intravenous Q24H   ??? mometasone-formoterol  2 puff Inhalation RT Q12H   ??? tamsulosin  0.4 mg Oral Daily 0900   ??? umeclidinium  62.5 mcg Inhalation RT Daily       Continuous Infusions:   ??? sodium chloride 0.9 % 100 mL/hr (09/14/16 0407)       PRN Meds: acetaminophen, albuterol, albuterol, ALPRAZolam **OR** ALPRAZolam, ibuprofen, magnesium hydroxide, ondansetron **OR** ondansetron, oxyCODONE **OR** oxyCODONE    Assessment/Plan:   Derrick Garner is a 66 y.o. male with Right epididymal orchitis, UTI and hematuria secondary to traumatic foley     1. Continue antibiotics and await cultures  2. Pt will need 2 weeks of antibiotics  3. Pt will need foley  for 7-14 days   4.Scrotal elevation  5. Pain control  6. Continue Flomax  7. Continue plan per Medicine  8. Call with questions    Discussed with staff     Gretel Acre. Vickey Sages, CNP  Urology  Pager: [phone]

## 2016-09-14 NOTE — Unmapped (Signed)
Problem: Acute Pain  Patient's pain progressing toward patient's stated pain goal   Goal: Patient will manage pain with the appropriate technique/intervention  Assess and monitor patient's pain using appropriate pain scale. Collaborate with interdisciplinary team and initiate plan and interventions as ordered.  Re-assess patient's pain level 30-60 minutes after pain management intervention.   Outcome: Progressing  Using 0-10 pain scale. Elevation and pain medication per MAR to relieve R. Testicular pain. Patient refusing ice packs. Continue to monitor throughout shift.

## 2016-09-14 NOTE — Unmapped (Signed)
Patient: Derrick Garner  ZOX:[id number]      Date of Birth: 1950-12-07  Age: 66 y.o.  Sex: male   PCP:  Donzetta Starch, MD Room/Bed:519/M519 Location: [facility]    MIG   Medicine Inpatient Group   Progress Note    09/14/2016 9:21 AM      Subjective  :   The patient was seen and examined. Notes and labs reviewed.  Derrick Garner is a 66 y.o. male with past medical history of Severe COPD on home oxygen, anxiety, history of BPH and recurrent UTI presented with right sided epididymitis .    The pain improves but continue to have edema and discoloration on right side. He has foley catheter with bloody urine. No fever or chills    Reviewed interval ancillary notes    Current Medications :     ??? heparin (porcine)  5,000 Units Subcutaneous 3 times per day   ??? levoFLOXacin  500 mg Intravenous Q24H   ??? mometasone-formoterol  2 puff Inhalation RT Q12H   ??? tamsulosin  0.4 mg Oral Daily 0900   ??? umeclidinium  62.5 mcg Inhalation RT Daily     Review of system:   All systems was reviewed.  No significant change in review of system except improves pain    Physical exam:     BP 106/62 (BP Location: Right arm, Patient Position: Lying)    Pulse 100    Temp 98.1 ??F (36.7 ??C) (Oral)    Resp 16    Ht 5' 11 (1.803 m)    Wt 128 lb (58.1 kg)    SpO2 96%    BMI 17.85 kg/m??     Intake/Output Summary (Last 24 hours) at 09/14/16 0921  Last data filed at 09/14/16 0600   Gross per 24 hour   Intake                0 ml   Output             2175 ml   Net            -2175 ml    Wt Readings from Last 3 Encounters:   09/13/16 128 lb (58.1 kg)   07/29/16 126 lb 6.4 oz (57.3 kg)   04/21/16 132 lb (59.9 kg)       General appearance:  Appears comfortable. Well nourished  Eyes: Sclera clear, pupils equal  ENT: Moist mucus membranes, no thrush. Trachea midline.  Cardiovascular: Regular rhythm, normal S1, S2. No murmur, gallop, rub. No edema in lower extremities  Respiratory: Diminished breath sounds bilaterally, no wheeze, good inspiratory  effort  Gastrointestinal: Abdomen soft, non-tender, not distended, normal bowel sounds  Genitourinary: edema and discolorations of right testicle  Neurology: Cranial nerves grossly intact. Alert and oriented in time, place and person. No speech or motor deficits  Psychiatry: Appropriate affect. Not agitated  Skin: Warm, dry, normal turgor, no rash      Laboratory and test:   CBC:   Recent Labs      09/13/16   1017  09/14/16   0603   WBC  16.8*  22.0*   HGB  13.4  12.5*   PLT  426*  370     BMP:    Lab 09/14/16  0603   SODIUM 135   POTASSIUM 4.9   CHLORIDE 100   CO2 29   BUN 18   CREATININE 1.19   CALCIUM 8.5*     Hepatic: Recent Labs  09/13/16   1017   AST  13   ALT  15   BILITOT  0.6   ALKPHOS  61     Ultrasound: Enlarged heterogeneous right testicle and epididymis with increased Doppler flow is suspicious for acute epididymoorchitis. Tiny foci of gas within the epididymis may represent early abscess formation.  ??  Normal sonographic appearance of the left testicle.    Problem List:     1.   Epididymitis  2.   Acute on chronic respiratory failure  3.   Acute renal failure  4.   Leukocytosis  5.   History of recurrent UTI    Assessment & Plan:     6. Patient withr Acute epididymitis and has been started on IV antibiotics. Escherichia coli, other coliforms, and Pseudomonas species are more frequent in older men, often in association with obstructive uropathy from benign prostatic hyperplasia.He recived Rocephin, Levaquin and follow the cultures and sensitivities.  Possible change to Oral antibiotic after culture and sensitivities.  7. Patient was started on IV fluids for acute renal failure. creatinine dropped to 1.1.  8. He is on bronchodilators, oxygen for chronic obstructive pulmonary disease  9. He is a DVT and GI prophylaxis    Signed: Henry Demeritt  09/14/2016 9:21 AM

## 2016-09-15 LAB — CBC
Hematocrit: 35.4 % — ABNORMAL LOW (ref 38.5–50.0)
Hemoglobin: 12 g/dL — ABNORMAL LOW (ref 13.2–17.1)
MCH: 30.2 pg (ref 27.0–33.0)
MCHC: 33.9 g/dL (ref 32.0–36.0)
MCV: 88.9 fL (ref 80.0–100.0)
MPV: 6.5 fL — ABNORMAL LOW (ref 7.5–11.5)
Platelets: 355 10E3/uL (ref 140–400)
RBC: 3.99 10E6/uL — ABNORMAL LOW (ref 4.20–5.80)
RDW: 13.1 % (ref 11.0–15.0)
WBC: 17.3 10E3/uL — ABNORMAL HIGH (ref 3.8–10.8)

## 2016-09-15 MED FILL — SODIUM CHLORIDE 0.9 % INTRAVENOUS SOLUTION: 100.00 100.00 mL/hr | INTRAVENOUS | Qty: 1000

## 2016-09-15 MED FILL — MAGNESIUM HYDROXIDE 2,400 MG/10 ML ORAL SUSPENSION: 2400 2,400 mg/10 mL | ORAL | Qty: 10

## 2016-09-15 MED FILL — TYLENOL 325 MG TABLET: 325 325 mg | ORAL | Qty: 2

## 2016-09-15 MED FILL — ALPRAZOLAM 0.5 MG TABLET: 0.5 0.5 MG | ORAL | Qty: 2

## 2016-09-15 MED FILL — HEPARIN (PORCINE) 5,000 UNIT/ML INJECTION SOLUTION: 5000 5,000 unit/mL | INTRAMUSCULAR | Qty: 1

## 2016-09-15 MED FILL — OXYCODONE 5 MG TABLET: 5 5 MG | ORAL | Qty: 2

## 2016-09-15 MED FILL — ONDANSETRON HCL (PF) 4 MG/2 ML INJECTION SOLUTION: 4 4 mg/2 mL | INTRAMUSCULAR | Qty: 2

## 2016-09-15 MED FILL — LEVOFLOXACIN 500 MG/100 ML IN 5 % DEXTROSE INTRAVENOUS PIGGYBACK: 500 500 mg/100 mL | INTRAVENOUS | Qty: 100

## 2016-09-15 MED FILL — TAMSULOSIN 0.4 MG CAPSULE: 0.4 0.4 mg | ORAL | Qty: 1

## 2016-09-15 NOTE — Unmapped (Signed)
Patient: Derrick Garner  UJW:[id number]      Date of Birth: 03/07/1951  Age: 66 y.o.  Sex: male   PCP:  Donzetta Starch, MD Room/Bed:519/M519 Location: [facility]    MIG   Medicine Inpatient Group   Progress Note    09/15/2016 10:59 AM      Subjective  :   The patient was seen and examined. Notes and labs reviewed.  Derrick Garner is a 66 y.o. male with past medical history of Severe COPD on home oxygen, anxiety, history of BPH and recurrent UTI presented with right sided epididymitis .    The patient generally doing better but complains of some pain in scrotum.  He has foley catheter with bloody urine that is more clear since yesterday. No fever or chills    Reviewed interval ancillary notes    Current Medications :     ??? heparin (porcine)  5,000 Units Subcutaneous 3 times per day   ??? levoFLOXacin  500 mg Intravenous Q24H   ??? mometasone-formoterol  2 puff Inhalation RT Q12H   ??? tamsulosin  0.4 mg Oral Daily 0900   ??? umeclidinium  62.5 mcg Inhalation RT Daily     Review of system:   All systems was reviewed.  No significant change in review of system except improves pain    Physical exam:     BP 113/72 (BP Location: Right arm, Patient Position: Lying)    Pulse 104    Temp 98.7 ??F (37.1 ??C) (Oral)    Resp 18    Ht 5' 11 (1.803 m)    Wt 128 lb (58.1 kg)    SpO2 96%    BMI 17.85 kg/m??     Intake/Output Summary (Last 24 hours) at 09/15/16 1059  Last data filed at 09/15/16 0855   Gross per 24 hour   Intake          1798.33 ml   Output             2025 ml   Net          -226.67 ml           General appearance:  Appears comfortable. Well nourished  Eyes: Sclera clear, pupils equal  ENT: Moist mucus membranes, no thrush. Trachea midline.  Cardiovascular: Regular rhythm, normal S1, S2. No murmur, gallop, rub. No edema in lower extremities  Respiratory: Diminished breath sounds bilaterally, no wheeze, good inspiratory effort  Gastrointestinal: Abdomen soft, non-tender, not distended, normal bowel  sounds  Genitourinary: edema and discolorations of right testicle  Neurology: Cranial nerves grossly intact. Alert and oriented in time, place and person. No speech or motor deficits  Psychiatry: Appropriate affect. Not agitated  Skin: Warm, dry, normal turgor, no rash      Laboratory and test:   CBC:   Recent Labs      09/13/16   1017  09/14/16   0603  09/15/16   0905   WBC  16.8*  22.0*  17.3*   HGB  13.4  12.5*  12.0*   PLT  426*  370  355     BMP:      Lab 09/14/16  0603   SODIUM 135   POTASSIUM 4.9   CHLORIDE 100   CO2 29   BUN 18   CREATININE 1.19   CALCIUM 8.5*     Hepatic:   Recent Labs      09/13/16   1017   AST  13  ALT  15   BILITOT  0.6   ALKPHOS  61     Ultrasound: Enlarged heterogeneous right testicle and epididymis with increased Doppler flow is suspicious for acute epididymoorchitis. Tiny foci of gas within the epididymis may represent early abscess formation.  ??  Normal sonographic appearance of the left testicle.    Problem List:     1.   Epididymitis  2.   Acute on chronic respiratory failure  3.   Acute renal failure  4.   Leukocytosis  5.   History of recurrent UTI    Assessment & Plan:     6. Patient withr Acute epididymitis and has been started on IV antibiotics.He is on IV Levaquin and follow the cultures and sensitivities. Improves leukocytosis.  Possible change to Oral antibiotic after culture and sensitivities. Follow with urology recommendation.  7. He is on IV fluids for acute renal failure. creatinine dropped to 1.1.  8. He is on bronchodilators, oxygen for chronic obstructive pulmonary disease  9. He is a DVT and GI prophylaxis    Signed: Mazie Fencl  09/15/2016 10:59 AM

## 2016-09-15 NOTE — Unmapped (Signed)
Problem: Fall Prevention  Goal: Patient will remain free of falls  Assess and monitor vitals signs, neurological status including level of consciousness and orientation.  Reassess fall risk per hospital policy.    Ensure arm band on, uncluttered walking paths in room, adequate room lighting, call light and overbed table within reach, bed in low position, wheels locked, side rails up per policy, and non-skid footwear provided.     Outcome: Progressing  Vital signs monitored per policy, neurological status and level of consciousness WNL and monitored per policy, risk for falls assessed with appropriate interventions in place. Arm band on patient, paths clear, adequate lighting, call light and overbed table within reach. Bed in low position with wheels locked and side rails up per policy. Non-skid footwear provided.

## 2016-09-15 NOTE — Unmapped (Signed)
CASE MANAGEMENT NOTE    Inpatient day 2 with epididymitis, acute on chronic respiratory failure, ARF. PMH of COPD, BPH, anxiety. Not ready for discharge. Remains on IV antibiotics. O2 demands above baseline (2 L), currently on 3 lpm NC.     From home with family. Review of chart indicates patient was set up with [employer] in December of 2017 for home 02.     Discharge plan: Return home. No new needs noted.     Will continue to monitor and assist with CM and discharge needs.     Jacklynn Lewis, RN, BSN, CCM  CASE MANAGER  [phone]

## 2016-09-15 NOTE — Unmapped (Signed)
Problem: Fall Prevention  Goal: Patient will remain free of falls  Assess and monitor vitals signs, neurological status including level of consciousness and orientation.  Reassess fall risk per hospital policy.    Ensure arm band on, uncluttered walking paths in room, adequate room lighting, call light and overbed table within reach, bed in low position, wheels locked, side rails up per policy, and non-skid footwear provided.     Outcome: Progressing      Problem: Pain  Goal: Patient's pain is progressing toward patient's stated pain goal  Assess and monitor patient's pain using appropriate pain scale. Collaborate with interdisciplinary team and initiate plan and interventions as ordered. Re-assess patient's pain level 30 - 60 minutes after pain management intervention.    Outcome: Progressing      Problem: Safety  Goal: Patient will be injury free during hospitalization  Assess and monitor vitals signs, neurological status including level of consciousness and orientation. Assess patient's risk for falls and implement fall prevention plan of care and interventions per hospital policy.      Ensure arm band on, uncluttered walking paths in room, adequate room lighting, call light and overbed table within reach, bed in low position, wheels locked, side rails up per policy, and non-skid footwear provided.    Outcome: Progressing      Problem: Daily Care  Goal: Daily care needs are met  Assess and monitor ability to perform self care and identify potential discharge needs.   Outcome: Progressing

## 2016-09-15 NOTE — Unmapped (Signed)
Problem: Inadequate Gas Exchange  Goal: Patient is adequately oxygenated and ventilation is improved  Assess and monitor vital signs, oxygen saturation, respiratory status to include rate, depth, effort, and lung sounds, mental status, cyanosis, and labs (ABG's).  Monitor effects of medications that may sedate the patient.  Collaborate with respiratory therapy to administer medications and treatments.   Outcome: Progressing

## 2016-09-15 NOTE — Unmapped (Signed)
D: VS: Blood pressure 105/62, pulse 102, temperature 100.3 ??F (37.9 ??C), temperature source Oral, resp. rate 20, height 5' 11 (1.803 m), weight 128 lb (58.1 kg), SpO2 97 %.  A: Scheduled meds given, prn tylenol given for fever. Foley cath in place-foley care given.  R scrotal redness and swelling.    R: Call light in reach, bed alarm on, fall precautions in place.  Will continue to monitor.    Rex Kras, RN

## 2016-09-16 LAB — BASIC METABOLIC PANEL
Anion Gap: 4 mmol/L (ref 3–16)
BUN: 15 mg/dL (ref 7–25)
CO2: 28 mmol/L (ref 21–33)
Calcium: 8 mg/dL (ref 8.6–10.3)
Chloride: 103 mmol/L (ref 98–110)
Creatinine: 1.17 mg/dL (ref 0.60–1.30)
Glucose: 117 mg/dL (ref 70–100)
Osmolality, Calculated: 282 mOsm/kg (ref 278–305)
Potassium: 5.2 mmol/L (ref 3.5–5.3)
Sodium: 135 mmol/L (ref 133–146)
eGFR AA CKD-EPI: 75 See note.
eGFR NONAA CKD-EPI: 65 See note.

## 2016-09-16 LAB — CBC
Hematocrit: 28.1 % (ref 38.5–50.0)
Hemoglobin: 9.6 g/dL (ref 13.2–17.1)
MCH: 30.1 pg (ref 27.0–33.0)
MCHC: 34.1 g/dL (ref 32.0–36.0)
MCV: 88.2 fL (ref 80.0–100.0)
MPV: 6.9 fL (ref 7.5–11.5)
Platelets: 298 10*3/uL (ref 140–400)
RBC: 3.19 10*6/uL (ref 4.20–5.80)
RDW: 12.7 % (ref 11.0–15.0)
WBC: 15.5 10*3/uL (ref 3.8–10.8)

## 2016-09-16 MED ORDER — aluminum & magnesium hydroxide-simethicone (MYLANTA, MAALOX) suspension 15 mL
400-400-40 | Freq: Four times a day (QID) | ORAL | Status: AC | PRN
Start: 2016-09-16 — End: 2016-09-17
  Administered 2016-09-16 – 2016-09-17 (×2): 15 mL via ORAL

## 2016-09-16 MED ORDER — oxyCODONE (ROXICODONE) 5 MG immediate release tablet
5 | ORAL_TABLET | ORAL | 0 refills | 6.00000 days | Status: AC | PRN
Start: 2016-09-16 — End: 2016-09-17

## 2016-09-16 MED ORDER — levoFLOXacin (LEVAQUIN) 500 MG tablet
500 | ORAL_TABLET | Freq: Every day | ORAL | 0 refills | Status: AC
Start: 2016-09-16 — End: 2016-11-07

## 2016-09-16 MED ORDER — docusate sodium (COLACE) capsule 200 mg
100 | Freq: Two times a day (BID) | ORAL | Status: AC
Start: 2016-09-16 — End: 2016-09-17
  Administered 2016-09-16 – 2016-09-17 (×3): 200 mg via ORAL

## 2016-09-16 MED FILL — DOCUSATE SODIUM 100 MG CAPSULE: 100 100 MG | ORAL | Qty: 2

## 2016-09-16 MED FILL — TAMSULOSIN 0.4 MG CAPSULE: 0.4 0.4 mg | ORAL | Qty: 1

## 2016-09-16 MED FILL — LEVOFLOXACIN 500 MG/100 ML IN 5 % DEXTROSE INTRAVENOUS PIGGYBACK: 500 500 mg/100 mL | INTRAVENOUS | Qty: 100

## 2016-09-16 MED FILL — SODIUM CHLORIDE 0.9 % INTRAVENOUS SOLUTION: 100.00 100.00 mL/hr | INTRAVENOUS | Qty: 1000

## 2016-09-16 MED FILL — OXYCODONE 5 MG TABLET: 5 5 MG | ORAL | Qty: 2

## 2016-09-16 MED FILL — ALPRAZOLAM 0.5 MG TABLET: 0.5 0.5 MG | ORAL | Qty: 4

## 2016-09-16 MED FILL — ONDANSETRON HCL 4 MG TABLET: 4 4 MG | ORAL | Qty: 1

## 2016-09-16 MED FILL — HEPARIN (PORCINE) 5,000 UNIT/ML INJECTION SOLUTION: 5000 5,000 unit/mL | INTRAMUSCULAR | Qty: 1

## 2016-09-16 MED FILL — MAGNESIUM HYDROXIDE 2,400 MG/10 ML ORAL SUSPENSION: 2400 2,400 mg/10 mL | ORAL | Qty: 10

## 2016-09-16 MED FILL — ALPRAZOLAM 0.5 MG TABLET: 0.5 0.5 MG | ORAL | Qty: 2

## 2016-09-16 MED FILL — MAG-AL PLUS EXTRA STRENGTH 400 MG-400 MG-40 MG/5 ML ORAL SUSPENSION: 400-400-40 400-400-40 mg/5 mL | ORAL | Qty: 30

## 2016-09-16 NOTE — Unmapped (Signed)
Occupational Therapy  Occupational Therapy Initial Assessment/Co-Evaluation with Physical Therapy  Co-treat with PT due to skilled assist of 2 needed to safely assess and/or address OOB activity.        Name: Derrick Garner  DOB: [birth year]  Attending Physician: Edwina Barth, MD  Admission Diagnosis: Epididymoorchitis [N45.3]  Cystitis [N30.90]  Date: 09/16/2016  Reviewed Pertinent hospital course: Yes  Hospital Course PT/OT: Derrick Garner??is a 66 y.o.??male??with past medical history of Severe COPD on home oxygen, anxiety, history of BPH and recurrent UTI presented with right sided epididymitis .  Precautions: fall risk, monitor vitals    Assessment  OT 6 Clicks  Help From Another Person To Put On/Take Off Regular Lower Body Clothing: A lot  Help From Another Person Bathing ( including washing ,rinsing,drying): A lot  Help From Another Person Toileting, which includes using the toilet, bedpan or urinal: A lot  Help From Another Person To Put On/Take Off Regular Upper Body Clothing: A lot  Help From Another Person Taking Care Of Personal Grooming: A little  Help From Another Person Eating Meals: None  OT 6 Clicks Score: 15  Assessment: Decreased ADL status, Decreased activity tolerance, Decreased Functional Mobility, Decreased high-level ADLs, Decreased self-care trans, Decreased Balance  Prognosis: Good, With continued OT s/p acute discharge, 24 hour supervision recommended  Goal Formulation: Patient    Goals  Pt Will demonstrate toilet transfer: Stand by assist  Pt Will demonstrate grooming task: Contact Guard (sink side)  Pt  Will demonstrate UE ADLs: Minimal  Pt Will demonstrate LE ADLs: Moderate  Pt Will tolerate completeing ADLs with pain less than 4/10: .  Pt Will participate in upper extremity HEP to prep for ADLs: .  Miscellaneous Goal #1: Pt will independently recall 3/5 energy conservation techniques related to ADL routine.   Miscellaneous Goal #2: Pt will maintain SPO2 at 92% or greater during ADL tasks.    Time frame for goals to be met in: one week, by 09/23/16    Recommendation  Plan  Treatment Interventions: ADL retraining, Functional transfer training, UE strengthening/ROM, Lower Extremity Intervention, Therapeutic Activity, Compensatory technique education, Patient/Family training, Equipment eval/education, Activity Tolerance training, Energy Conservation  Progress: Slow progress, medical status limitations  OT Frequency: 3-5x/wk  Recommendation  Recommendation: Short-term skilled OT  Equipment Recommended: Hospital bed   Pt requires frequent changes in body position and/or has an immediate need for a change in body position.  The pt requires the head of the bed to be elevated more than 30 degrees most of the time due to congestive heart failure, chronic pulmonary disease, or problems with aspiration. Pt requires supplemental O2 to maintain SPO2 at or greater than 92% and exhibits moderate to severe DOE.        Problem List  Patient Active Problem List   Diagnosis   ??? SOB (shortness of breath) on exertion   ??? Epididymitis        Past Medical History  Past Medical History:   Diagnosis Date   ??? Anxiety    ??? 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 History of alcohol abuse    ??? Hypoxemia requiring supplemental oxygen    ??? Tobacco abuse         Past Surgical History  Past Surgical History:   Procedure Laterality Date   ??? COLONOSCOPY W/ BIOPSIES AND POLYPECTOMY          Patient Stated Goals  Goal #1: decrese SOB  Home Living/Prior Function  Type of Home: House (3 STE,  no rails)  Home Layout: Two level;Bed/bath upstairs (1 Flight of stairs up to bedroom/bathroom: 1 HR. Full bathroom on main level of home)  Bathroom Shower/Tub: Electrical engineer: Biomedical scientist: Accessible  Home Equipment:  (pt reports he owns no medical DME)  Additional Comments: pt denies any falls within the last year  Prior Function  Level of  Independence: Independent  Lives With:  (Brother and Sister In [place])  Receives Help From: Family  ADL Assistance: Independent  Homemaking/IADL Assistance: Needs assistance (Family assists with homemaking task)  Comments: pt reports he most often sleeps in a recliner     Pain  Pain Score:   6  Pain Location: Back  Pain Descriptors: Sore  Pain Intervention(s): Emotional support;Repositioned  Therapist reported pain to:: RN aware and monitoring     Vision  Current Vision:  (i wear glasses to drive and to read certain things, but I dont wear them all the time)    Cognition  Arousal/Alertness: Appropriate responses to stimuli  Orientation Level: Oriented X4    Sensation  Light Touch: No apparent deficits  Additional Comments: 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    Perception  Perception  Inattention/Neglect: Appears intact  Initiation: Appears intact  Motor Planning: Appears intact  Perseveration: Not present    Right Upper Extremity   RUE Assessment: Within Functional Limits         Left Upper Extremity  LUE Assessment: Within Functional Limits         Hand Function  Gross Grasp: Functional  Coordination: Functional        Functional Mobility  Bed Mobility Eval  Supine to Sit: Minimal assistance;Bed Rail use;Head of Bed elevated  Sit to Supine: Minimal assistance  Transfers Eval  Sit to Stand: Minimal assistance  Toilet Transfers: Minimal assistance;Additional time;Grab bars  Functional Mobility: Minimal (with RW, to walk to/from bathroom)  Balance Eval  Sitting - Static: Supervision  Sitting-Dynamic: Stand by assist   Standing-Static: Contact Guard Assistance (with RW)  Standing-Dynamic: Minimal Assistance (with RW)    ADL  Where Assessed: Edge of bed  Grooming Assistance: Stand by  Grooming Deficit: Verbal cueing;Supervision/safety;Increased time to complete  UE Dressing Assistance: Moderate  UE Dressing Deficit: Verbal cueing;Supervision/safety;Increased time to complete;Thread RUE;Thread LUE;Pull around back;Pull  down in back;Fasteners  LE Dressing Assistance: Maximal  LE Dressing Deficit: Verbal cueing;Supervision/safety;Increased time to complete (to adjust socks)  Toileting Assistance with Device: Total (pt with foley catheter)    Vitals  Pt's vitals monitored throughout session. Pt initially on 3L O2 via NC and c/o feeling SOB upon supine to sit at EOB. Pt's SPO2 decreased to 87% with this transition and pt's HR increased to 128. Increased O2 to 4L and pt's SPO2 recovered to 91% after two minutes of pursed lip breathing. Pt ambulated to toilet and SPO2 decresed to 88% on 4L O2 and HR increased to 135. Pt began to c/o chest pain, however was unable to describe despite multiple choices. Assisted pt to return to supine in bed, BP 129/54 and RN at b/s. Multiple extended rest breaks taken throughout session. Pt often sat in an unassisted sitting position, with feet flat on the floor, leaning chest forward with elbows resting on knees to improve SOB.     Patient Education  Pt. educated on the role of OT/POC, functional transfers and safety with ADLs. Pt demo good understanding  of education.     Positioning of Patient:  Pt laying in bed with needs in reach and bed alarm activated. RN at bedside.     Handoff of Care:   Safety Handoff completed with RN, Asher Muir, after session.        Time  Start Time: 1429  Stop Time: 1503  Time Calculation (min): 34 min    Charges  $OT Evaluation Mod Complex 45 Min: 1 Procedure       97166- Moderate Complexity Evaluation  This evaluation code was determined based on analysis of pt's occupational profile, performance deficits, and level of clinical decision-making to complete OT evaluation    **Occupational Profile: Moderate Complexity  Expanded chart review        **Performance deficits: Moderate Complexity  3-5 performance deficits      **Clinical Decision-making: Moderate Complexity     Co-morbidities affecting pt's current performance: see above    Current level of physical/ verbal assistance:    Min physical/verbal assistance      Cordie Grice, OTR/L  License #: [id number]  Ascom: [phone]  09/16/2016

## 2016-09-16 NOTE — Unmapped (Signed)
DISCHARGE SUMMARY  Patient ID: Derrick Garner   MRN: [id number] Date of birth: [birth year]    Admit date: 09/13/2016   Discharge date: 09/16/2016    Patient's primary care provider: Donzetta Starch, MD    Discharge Diagnoses:   1.   Epididymitis  2.   Acute on chronic respiratory failure  3.   Acute renal failure  4.   Leukocytosis  5.   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     Hospital Course:  Derrick Garner is a 66 y.o. male with past medical history of Severe COPD on home oxygen, anxiety, history of BPH and recurrent UTI presented with right sided testicular edema and tenderness.  Patient states the symptoms started 3-4 days ago and has been worsening since then. She denies any drainage, fever or chills. Patient was admitted for Acute epididymitis and has been started on IV antibiotics. Escherichia coli, other coliforms, and Pseudomonas species are more frequent in older men, often in association with obstructive uropathy from benign prostatic hyperplasia. Plan to start on Rocephin, Levaquin and cultures was negative. Urology was consulted. Antibiotis was changed to oral Levaquin.  1. Continue antibiotics and transition to po antibiotics  2. Scrotal elevation  3. Pt to keep foley for 2 weeks  4. Pt to f/u in 2 weeks with Dr. Vickii Penna  5. Continue plan per Medicine   6. Leg bag teaching and home health referral     Physical examination on discharge day:   BP 110/61 (BP Location: Right arm, Patient Position: Lying)    Pulse 99    Temp 97.7 ??F (36.5 ??C) (Oral)    Resp 22    Ht 5' 11 (1.803 m)    Wt 133 lb 12.8 oz (60.7 kg)    SpO2 96%    BMI 18.66 kg/m??   General appearance:  Alert. Appears comfortable.  HEENT: Sclera clear. Moist mucus membranes.Trachea midline  Cardiovascular: Regular rate and rhythm, normal  S1, S2. No murmur. No edema in lower extremities.  Respiratory: Clear to auscultation bilaterally, no wheeze, good inspiratory effort  GI: Abdomen soft, non-tender, not distended, normal bowel sounds  Neurology: CN 2-12 grossly intact  Skin: Warm, dry, normal turgor    Labs and Tests:  CBC:   Recent Labs      09/14/16   0603  09/15/16   0905  09/16/16   0419   WBC  22.0*  17.3*  15.5*   HGB  12.5*  12.0*  9.6*   PLT  370  355  298     BMP:    Recent Labs      09/14/16   0603  09/16/16   0419   NA  135  135   K  4.9  5.2   CL  100  103   CO2  29  28   BUN  18  15   CREATININE  1.19  1.17   GLUCOSE  112*  117*     Hepatic: No results for input(s): AST, ALT, BILITOT, ALKPHOS in the last 72 hours.    Invalid input(s): ALB    Consults: ED CONTACT PROVIDER [facility]  ED CONTACT PROVIDER [facility]  IP CONSULT TO UROLOGY    Follow Up: Donzetta Starch, MD in 1  wk   Disposition:  Home   Activity: No limitations on physical activity.   Instructions:    Infection orders: If you get a temperature over 101F,  then call Donzetta Starch, MD .  1. Continue antibiotics and transition to po antibiotics  2. Scrotal elevation  3. Pt to keep foley for 2 weeks  4. Pt to f/u in 2 weeks with Dr. Vickii Penna  5. Continue plan per Medicine   6. Leg bag teaching and home health referral   7. Call with questions  Diet:    Regular diet    Discharge Medications:   Derrick Garner, Derrick Garner   [facility] Medication Instructions [id number]    Printed on:09/16/16 1226   Medication Information                      albuterol (PROVENTIL) 2.5 mg /3 mL (0.083 %) nebulizer solution  Inhale 2.5 mg by nebulization every 6 hours as needed.                       albuterol (PROVENTIL;VENTOLIN;PROAIR) 90 mcg/actuation inhaler  Inhale 2 puffs into the lungs every 4 hours as needed for Wheezing.             ALPRAZolam (XANAX) 1 MG tablet  Take 1 mg by mouth 2 times a day.                       ALPRAZolam (XANAX) 1 MG tablet  Take 2 mg by mouth after lunch. Between 1400-1500              diphenhydrAMINE (BENADRYL) 25 mg capsule  Take 25-50 mg by mouth if needed.                       fluticasone-salmeterol (ADVAIR) 250-50 mcg/dose diskus inhaler  Inhale 2 puffs into the lungs 2 times a day.                       food supplemt, lactose-reduced (ENSURE ORIGINAL) Liqd  Take 1 Can by mouth 3 times a day. vanilla             ibuprofen (ADVIL) 200 MG tablet  Take 400 mg by mouth if needed.                       levoFLOXacin (LEVAQUIN) 500 MG tablet  Take 1 tablet (500 mg total) by mouth daily.             oxyCODONE (ROXICODONE) 5 MG immediate release tablet  Take 1 tablet (5 mg total) by mouth every 4 hours as needed for up to 20 days.             tamsulosin (FLOMAX) 0.4 mg Cp24  Take 0.4 mg by mouth daily.             tiotropium (SPIRIVA) 18 mcg  Inhale 1 capsule (18 mcg total) into the lungs daily.                  Spent 35 minutes in discharge process    Thank you, Dr. Donzetta Starch, MD, for allowing [place] to take care of the patient Derrick Garner . The patient needs to be followed by the primary care physician, Dr. Donzetta Starch, MD.   Signed:  Edwina Barth    09/16/2016 12:26 PM

## 2016-09-16 NOTE — Unmapped (Signed)
Assessment completed of Derrick Garner with ordered PRN aerosolized medication(s).  Determined that no medication was needed at this time.

## 2016-09-16 NOTE — Unmapped (Signed)
-  Case Management Note-    CM noted discharge order.  CM spoke with patient's nurse and she states that patient's sister in law has called several times and is concerned about patient returning home.  She feels he will need SNF at discharge.    CM met with patient to discuss discharge plans.  He states he prefers to return home and is agreeable to home health care.  He does not feel he needs SNF.    CM called patient's sister in law Angelique Blonder (with patient's permission) to discuss discharge plans.  She states she feels her husband and her are not able to care for patient at home.  CM explained to her that it is ultimately the patient's decision since he is alert and oriented.  She states she is going to call and speak with patient and will call this CM back.    CM explained to both [facility] and patient the process behind getting patient to a SNF.  It will require PT/OT evals and he will need a precert.    Jocelyn Lamer RN, BSN  Case Manager  [phone]

## 2016-09-16 NOTE — Unmapped (Signed)
-  Case Management Note-    CM received phone call from [place], patient's sister in law, stating that she has discussed again with patient about him going to SNF and he is now agreeable if his insurance will cover it.    CM met with patient and he is reluctant but agreeable.  CM placed order for PT/OT evals and notified Megan from OT about need for these evals to take place today due to need for precert and patient already has a discharge order.  She states she will page this out and have him evaluted today.    CM notified patient of this information.  CM will follow along for therapy recommendations and then discuss these again with patient.    -Discharge Plan-  Unsure at this time - PT/OT evals pending.    Jocelyn Lamer RN, BSN  Case Manager  [phone]

## 2016-09-16 NOTE — Unmapped (Signed)
Urology Progress Note    Name: Derrick Garner  Admit Date: 09/13/2016  OR Date:     Subjective:   Patient seen and examined this morning. No acute events over night. Tmax 100.3. Labs stable and WBC trending downward. Foley in place and hematuria resolved. Pt notes right testes pain is improving.     Objective:   Vitals:  Temp:  [97.7 ??F (36.5 ??C)-100.3 ??F (37.9 ??C)] 97.7 ??F (36.5 ??C)  Heart Rate:  [93-102] 99  Resp:  [18-22] 22  BP: (101-110)/(61-62) 110/61    Intake/Output:    Date 09/15/16 0700 - 09/16/16 0659 09/16/16 0700 - 09/17/16 0659   Shift 1610-9604 1500-2259 2300-0659 24 Hour Total 0700-1459 1500-2259 2300-0659 24 Hour Total   I  N  T  A  K  E   P.O. 462  100 562          P.O. 462  100 562        I.V.  (mL/kg) 1753.3  (30.2) 892  (15.4) 817  (13.5) 3462.3  (57)          Volume (mL) (sodium chloride 0.9 % infusion) 1753.3 440 452 9035.3        IV Piggyback  100  100          Volume (mL) (levoFLOXacin (LEVAQUIN) 500 mg in D5W 100 mL IVPB)  100  100        Shift Total  (mL/kg) 2215.3  (38.2) 992  (17.1) 917  (15.1) 4124.3  (68)       O  U  T  P  U  T   Urine  (mL/kg/hr) 850  (1.8) 350  (0.8) 550  (1.1) 1750  (1.2)          Output (mL) (IUC (Foley) 16 Fr.) 850 [phone]        Shift Total  (mL/kg) 850  (14.6) 350  (6) 550  (9.1) 1750  (28.8)       Weight (kg) 58.1 58.1 60.7 60.7 60.7 60.7 60.7 60.7       Physical Exam:  Gen: Alert and oriented x3, no acute distress  HEENT: NCAT  CV: Regular rate and rhythm  Resp: No respiratory distress  Abd: Soft, non-distended, non-tender  Ext: Warm and well perfused  GU: Foley in place urine amber and clear, Left teste wnl, Right teste swelling improving but still tender to touch     Labs:  Recent Labs      09/14/16   0603  09/15/16   0905  09/16/16   0419   WBC  22.0*  17.3*  15.5*   HGB  12.5*  12.0*  9.6*   HCT  36.6*  35.4*  28.1*   PLT  370  355  298     Recent Labs      09/14/16   0603  09/16/16   0419   NA  135  135   K  4.9  5.2   CL  100  103   CO2  29  28   BUN   18  15   CREATININE  1.19  1.17   GLUCOSE  112*  117*   CALCIUM  8.5*  8.0*       Current Medications:  Scheduled Meds:   ??? heparin (porcine)  5,000 Units Subcutaneous 3 times per day   ??? levoFLOXacin  500 mg Intravenous Q24H   ??? mometasone-formoterol  2 puff Inhalation RT Q12H   ???  tamsulosin  0.4 mg Oral Daily 0900   ??? umeclidinium  62.5 mcg Inhalation RT Daily       Continuous Infusions:   ??? sodium chloride 0.9 % 100 mL/hr (09/16/16 0551)       PRN Meds: acetaminophen, albuterol, albuterol, ALPRAZolam **OR** ALPRAZolam, ibuprofen, magnesium hydroxide, ondansetron **OR** ondansetron, oxyCODONE **OR** oxyCODONE    Assessment/Plan:   Derrick Garner is a 66 y.o. male with Right epididymal orchitis and resolved hematuria     1. Continue antibiotics and transition to po antibiotics  2. Scrotal elevation  3. Pt to keep foley for 2 weeks  4. Pt to f/u in 2 weeks with Dr. Vickii Penna  5. Continue plan per Medicine   6. Leg bag teaching and home health referral   7. Call with questions    Discussed with staff     Gretel Acre. Vickey Sages, CNP  Urology  Pager: [phone]

## 2016-09-16 NOTE — Unmapped (Addendum)
-  Case Management Note-    CM spoke with Elnita Maxwell at [facility] and she states they are full and do not have an available bed for patient at this time.     CM also spoke with Byrd Hesselbach at [facility] who state they are out of network with patient's insurance.    CM called and spoke with Angelique Blonder, patient's sister in law and notified her of this information.  She would like referral made to [facility].  Patient is in agreement with this.  Referral made via [employer].  Waiting to see if they can accept.    ADDENDUM at 1700:  CM spoke with Maralyn Sago at [facility] and she states they are able to accept patient and will initiate precert this evening.  CM notified both patient and sister in law [place].  They both remain in agreement with this discharge plan.    -Discharge Plan-  [facility] - precert to start this evening.    Jocelyn Lamer RN, BSN  Case Manager  [phone]

## 2016-09-16 NOTE — Unmapped (Signed)
-  Case Management Note-    PT and OT are recommending SNF.  CM met with patient again and he is reluctant to go to SNF but is in agreement.  CM made a referral to [facility] and [facility] per request of patient and his brother.    Referrals sent via [employer].  Waiting to see if they can accept.  Will need precert.  MD notified and discharge order discontinued.    -Discharge Plan-  Referrals to [facility] and [facility].    Jocelyn Lamer RN, BSN  Case Manager  [phone]

## 2016-09-16 NOTE — Unmapped (Signed)
D: VS: Blood pressure 96/58, pulse 108, temperature 98.2 ??F (36.8 ??C), temperature source Oral, resp. rate 16, height 5' 11 (1.803 m), weight 133 lb 12.8 oz (60.7 kg), SpO2 91 %.    A: Scheduled meds given. PRN maalox given for heartburn and PRN oxycodone given for pain.     R: Call light in reach, will continue to monitor.    Rex Kras, RN

## 2016-09-16 NOTE — Unmapped (Addendum)
REFERRAL FOR HOME HEALTH SERVICES FORM     Patient name: Derrick Garner  Patient MRN: [id number]  DOB: 07/04/1950  Age: 66 y.o.  Gender: male  SSN: [id number]  Address: [address] [place] [address]     Phone number: [phone] (home)    Patient emergency contact: Extended Emergency Contact Information  Primary Emergency Contact: Lenna [place]  Mobile Phone: [phone]  Relation: Brother  Preferred language: English  Interpreter needed? No  Secondary Emergency Contact: Carnegie,Colby   [place]  Mobile Phone: [phone]  Relation: Daughter  Preferred language: English  Interpreter needed? No    Date of admission: 09/13/2016  Date of discharge: 09/17/2016  Attending provider: Edwina Barth, MD  Primary care physician: Donzetta Starch, MD    Code status: Full Code  Allergies: No Known Allergies    Insurance Information     Insurance Information                [place] MANAGED MEDICARE/HU[employer] GOLD PLUS MEDICARE Phone:     Subscriber: Randell, Teare Subscriber#: [id number]    Group#: [id number] Precert#:           Diagnoses Present on Admission   Primary Diagnosis: Epididymitis  Discharge Diagnosis :   Active Hospital Problems    Diagnosis Date Noted   ??? Epididymitis [N45.1] 09/13/2016      Resolved Hospital Problems    Diagnosis Date Noted Date Resolved   No resolved problems to display.     Prognosis: good  Rehabilitation potential: good    Diet     Diet Orders          Diet regular starting at 06/18 1528           Regular Diet    Services Required   Home Health Aide    Weight bearing status: full    Needs 24 hour supervision due to cognitive impairment: No    Discharge Medications   Medications:  Current Discharge Medication List      START taking these medications    Details   levoFLOXacin (LEVAQUIN) 500 MG tablet Take 1 tablet (500 mg total) by mouth daily.  Qty: 14 tablet, Refills: 0      oxyCODONE (ROXICODONE) 5 MG immediate release tablet Take 1 tablet (5 mg total) by  mouth every 4 hours as needed for up to 20 days.  Qty: 30 tablet, Refills: 0    Associated Diagnoses: Epididymoorchitis         CONTINUE these medications which have NOT CHANGED    Details   albuterol (PROVENTIL) 2.5 mg /3 mL (0.083 %) nebulizer solution Inhale 2.5 mg by nebulization every 6 hours as needed.                albuterol (PROVENTIL;VENTOLIN;PROAIR) 90 mcg/actuation inhaler Inhale 2 puffs into the lungs every 4 hours as needed for Wheezing.  Qty: 1 Inhaler, Refills: 11    Associated Diagnoses: Stage 4 very severe COPD by GOLD classification (CMS Dx)      !! ALPRAZolam (XANAX) 1 MG tablet Take 1 mg by mouth 2 times a day.                !! ALPRAZolam (XANAX) 1 MG tablet Take 2 mg by mouth after lunch. Between 1400-1500      diphenhydrAMINE (BENADRYL) 25 mg capsule Take 25-50 mg by mouth if needed.  fluticasone-salmeterol (ADVAIR) 250-50 mcg/dose diskus inhaler Inhale 2 puffs into the lungs 2 times a day.                food supplemt, lactose-reduced (ENSURE ORIGINAL) Liqd Take 1 Can by mouth 3 times a day. vanilla      ibuprofen (ADVIL) 200 MG tablet Take 400 mg by mouth if needed.                tamsulosin (FLOMAX) 0.4 mg Cp24 Take 0.4 mg by mouth daily.      tiotropium (SPIRIVA) 18 mcg Inhale 1 capsule (18 mcg total) into the lungs daily.  Qty: 30 capsule, Refills: 6       !! - Potential duplicate medications found. Please discuss with provider.              Discharge Specific Orders   Discharge specific orders:  None required  Isolation       Vitals     Patient Vitals for the past 4 hrs:   BP Temp Temp src Pulse Resp SpO2   09/17/16 0813 - - - - - 97 %   09/17/16 0751 111/58 98.5 ??F (36.9 ??C) Oral 96 16 96 %       Equipment/Supplies   .  No current labs  Ordering Physician: NPI  Edwina Barth, MD]    Physician Certification   Further, I certify that my clinical findings support that this patient is homebound (i.e. absences from home require considerable and taxing effort and are for  medical reasons or religious services or infrequently or short duration when for other reasons) due to deconditioning it would be a taxing effort to receive outpatient services.    My signature below is to certify that this patient is under my care and that I, or nurse practitioner, or a physician assistant working with me, had a face-to-face encounter with this is patient on: 09/17/2016     Follow-up Appointments and [facility] Discharge Physician Name     Future Appointments  Date Time Provider Department Center   10/04/2016 8:45 AM Gillis Ends [facility] URO [facility]   10/27/2016 12:15 PM [facility] ECHO [facility] ECHO [facility]   10/27/2016 2:00 PM NORTH CT 1 [facility] CT [facility]     No follow-up provider specified.    Discharging Physician Signature and Credentials   Discharging Physician: Electronically signed by Edwina Barth  09/17/2016, 11:24 AM    Physician to follow up Information   PCP: Donzetta Starch, MD  PCP address: [address] [place] / [facility] [address]  PCP phone number: [phone]  PCP fax number: [phone]    If PCP is not following patient, type physician contact information here:           Patient will be followed by PCP    Discharge Planner and Credentials     Provider/Company Name and Contact Number:                                                    Discharge Planner Name and Telephone Number:

## 2016-09-16 NOTE — Unmapped (Signed)
Physical Therapy  Physical Therapy Initial Assessment/ Co-eval with OT for safety     Name: Derrick Garner  DOB: 03/04/1951  Attending Physician: Edwina Barth, MD  Admission Diagnosis: Epididymoorchitis [N45.3]  Cystitis [N30.90]  Date: 09/16/2016  Reviewed Pertinent hospital course: Yes  Hospital Course PT/OT: Derrick Garner??is a 66 y.o.??male??with past medical history of Severe COPD on home oxygen, anxiety, history of BPH and recurrent UTI presented with right sided epididymitis .  Precautions: fall risk, monitor vitals  Assessment  PT 6 Clicks  Help From Another Person Turning From Back to Side While Flat in Bed Without Using Siderails: A little  Help From Another Person Moving From Lying On Back To Sitting Without Using Siderails: A little  Help From Another Person Moving To And From Bed To Chair: A little  Help From Another Person Standing Up From Chair Using Your Arms: A little  Help From Another Person To Walk In Hospital Room: A little  Help From Another Person Climbing 3-5 Steps With A Railing: A lot  PT 6 Clicks Score: 17  Assessment: Impaired Bed Mobility, Impaired Transfer Mobility, Impaired Ambulation, Impaired Balance, Deconditioning, Impaired Safety Awareness, Impaired ROM, Impaired Strength  Prognosis: Good  Goal Formulation: Patient  Goals  Pt Will Go Supine To Sit: Supervision  Sit To Stand: Supervision  Pt Will Ambulate: Supervision (50' with a RW)  Pt Will report pain at 4/10 or less with func mobility: -  Miscellaneous Goal #1: Pt will participate in progressive exercise program to improve muscle performance and functional mobility  Time frame for goals to be met in: To be met by 09-21-16 .    Vitals: BP: 129/54, HR at rest 111 bpm after gait activity HR increased to 135 bpm, o2 saturation decreased to 85% on 4L of o2 after walking to the bathroom. Pt reporting increased c/o chest burning. RN notified immediatly via phone and present in room. After seated rest break pt reporting chest burning  sensation is resolving. RN at bedside at end of therapy session assessing patient.    Recommendation  Plan  Treatment/Interventions: LE strengthening/ROM, Endurance training, Patient/family training, Equipment eval/education, Museum/gallery curator, Therapeutic Exercise, Therapeutic Activity, Continued evaluation, Compensatory technique education, Gait training, Neuromuscular Reeducation  PT Frequency: 3-5x/wk    Recommendation  Recommendation: Short-term skilled PT  Problem List  Patient Active Problem List   Diagnosis   ??? SOB (shortness of breath) on exertion   ??? Epididymitis      Past Medical History  Past Medical History:   Diagnosis Date   ??? Anxiety    ??? 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 History of alcohol abuse    ??? Hypoxemia requiring supplemental oxygen    ??? Tobacco abuse       Past Surgical History  Past Surgical History:   Procedure Laterality Date   ??? COLONOSCOPY W/ BIOPSIES AND POLYPECTOMY       Patient Stated Goals  Goal #1: to go home   Home Living/Prior Function  Type of Home: House (3 STE,  no rails)  Home Layout: Two level;Bed/bath upstairs (1 Flight of stairs up to bedroom/bathroom: 1 HR. Full bathroom on main level of home)  Bathroom Shower/Tub: Medical sales representative: Standard  Bathroom Equipment: Biomedical scientist: Accessible  Home Equipment:  (pt reports he owns no medical DME)  Additional Comments: pt denies any falls within the last year  Level of Independence: Independent  Lives With:  (  Brother and Sister In [place])  Receives Help From: Family  ADL Assistance: Independent  Homemaking/IADL Assistance: Needs assistance (Family assists with homemaking task)  Comments: pt reports he most often sleeps in a recliner     Pain  Pain Score:   6  Pain Location: Back  Pain Descriptors: Sore  Pain Intervention(s): Emotional support;Repositioned  Therapist reported pain to:: RN aware and monitoring    Vision  Current Vision:  (i wear glasses to  drive and to read certain things, but I dont wear them all the time)    Cognition  Overall Cognitive Status: Within Functional Limits  Arousal/Alertness: Appropriate responses to stimuli  Orientation Level: Oriented X4    Sensation  Light Touch: No apparent deficits  Additional Comments: 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 and increased numbness/tingling  Inattention/Neglect: Appears intact  Initiation: Appears intact  Motor Planning: Appears intact  Perseveration: Not present    Upper Extremity  RUE Assessment: Within Functional Limits  LUE Assessment: Within Functional Limits     Lower Extremity  RLE Assessment  RLE Assessment: Within Functional Limits (Gross strength 3+/5)  LLE Assessment  LLE Assessment: Within Functional Limits (Gross strength 3+/5)     Functional Mobility  Bed Mobility Eval  Supine to Sit: Minimal assistance;Bed Rail use;Head of Bed elevated  Sit to Supine: Minimal assistance  Transfers Eval  Sit to Stand: Minimal assistance  Gait Eval  Pattern Eval : Shuffle;R Decreased stance time;L Decreased stance time (slow cadence, step to gait pattern, pt very unsteady requiring maximal verbal cuing for safety during mobility, B knees flexed)  Gait Assistance Eval: Minimal assistance  Assistive Device Eval: Rolling walker  Distance Eval: 15' x2   Vital assessment: After Monitoring vitals closely throughout treatment session o2 saturation decreased to 87% (after 1-2 minutes of verbal/visual cuing for pursed lip breathing techniques o2 saturation returned to above 90% on 4L of o2. RN aware and monitoring)  Balance Eval  Sitting - Static: Supervision  Sitting-Dynamic: Stand by assist   Standing-Static: Contact Guard Assistance (with RW)  Standing-Dynamic: Minimal Assistance (with RW)    Patient Education:   1.  Benefits of increased OOB mobility and upright positioning.  Role of PT.  Assist needed for all mobility OOB.          Handoff of Care:  Safety Handoff completed with RN after Rehabilitation  session.      Position at End of Therapy Session:  Patient in bed with call light/needs in reach and bed alarm activated.RN at bedside.    CPT codes: Moderate Complexity: 97162   A. Personal factors and/or Comorbidites / Patient History that impacts plan of care: See below and above problem list   Moderate Complexity: 1-2               Pt is a 66 y.o. admitted with a chief c/o    Chief Complaint   Patient presents with   ??? 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                             Past Surgical History:   Procedure Laterality Date   ??? COLONOSCOPY W/ BIOPSIES AND POLYPECTOMY         B.  An examination of body systems(s) musculoskeletal, neuromuscular, cardiovascular/pumonary, integumentary using standardized tests and measures:  Refer to above examination for details.     Moderate Complexity: 3 or more elements              *,pain, Impaired LE strength, decreased overall activity tolerance/endurance, impaired balance   C. A clinical presentation with unstable stable and/or evolving characteristics   Moderate Complexity: Evolving with Changing Characteristics  D. Clinical decision making using standardized patient assessment instrument and/or measurable assessment of functional outcome.   Moderate Complexity 97162      Time  Start Time: 1427  Stop Time: 1500  Time Calculation (min): 33 min    Charges   $PT Evaluation Mod Complex 30 Min: 1 Procedure                          [employer] PT,DPT   Physical Therapist, License #: [id number]  ASCOM # [id number]  09/16/2016 4:41 PM

## 2016-09-17 MED ORDER — levoFLOXacin (LEVAQUIN) tablet 500 mg
500 | Freq: Every day | ORAL | Status: AC
Start: 2016-09-17 — End: 2016-09-17
  Administered 2016-09-17: 19:00:00 500 mg via ORAL

## 2016-09-17 MED ORDER — ALPRAZolam (XANAX) 1 MG tablet
1 | ORAL_TABLET | Freq: Three times a day (TID) | ORAL | 0 refills | Status: AC | PRN
Start: 2016-09-17 — End: 2016-09-27

## 2016-09-17 MED ORDER — oxyCODONE (ROXICODONE) 5 MG immediate release tablet
5 | ORAL_TABLET | ORAL | 0 refills | 6.00000 days | Status: AC | PRN
Start: 2016-09-17 — End: 2016-10-07

## 2016-09-17 MED FILL — OXYCODONE 5 MG TABLET: 5 5 MG | ORAL | Qty: 2

## 2016-09-17 MED FILL — LEVAQUIN 500 MG TABLET: 500 500 mg | ORAL | Qty: 1

## 2016-09-17 MED FILL — DOCUSATE SODIUM 100 MG CAPSULE: 100 100 MG | ORAL | Qty: 2

## 2016-09-17 MED FILL — TAMSULOSIN 0.4 MG CAPSULE: 0.4 0.4 mg | ORAL | Qty: 1

## 2016-09-17 MED FILL — ALPRAZOLAM 0.5 MG TABLET: 0.5 0.5 MG | ORAL | Qty: 2

## 2016-09-17 MED FILL — SODIUM CHLORIDE 0.9 % INTRAVENOUS SOLUTION: 100.00 100.00 mL/hr | INTRAVENOUS | Qty: 1000

## 2016-09-17 NOTE — Plan of Care (Signed)
Pt resting in bed at this time.  Pain meds administered per Mar.  Possible d/c today.  No new concerns.

## 2016-09-17 NOTE — Unmapped (Signed)
Physical Therapy  Discharge Report     Patient Identification  Derrick Garner is a 66 y.o. male.  DOB:  [birth year]  Admit Date:  09/13/2016  Discharge date and time: 09/17/2016  5:33 PM   Attending Provider: No att. providers found                                   Admission Diagnoses: Epididymoorchitis [N45.3]  Cystitis [N30.90]  Reviewed Pertinent hospital course: Yes      Patient discharged from hospital 09/17/16    No services rendered following eval there fore no goals met no equipment issued.    Patient discharged from PT services    [facility] [phone]  Phone- [phone]  09/24/2016

## 2016-09-17 NOTE — Unmapped (Signed)
DISCHARGE SUMMARY  Patient ID: Derrick Garner   MRN: [id number] Date of birth: February 22, 1951    Admit date: 09/13/2016   Discharge date: 09/17/2016    Patient's primary care provider: Donzetta Starch, MD    Discharge Diagnoses:   1.   Epididymitis  2.   Acute on chronic respiratory failure  3.   Acute renal failure  4.   Leukocytosis  5.   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     Hospital Course:  Derrick Garner is a 66 y.o. male with past medical history of Severe COPD on home oxygen, anxiety, history of BPH and recurrent UTI presented with right sided testicular edema and tenderness.  Patient states the symptoms started 3-4 days ago and has been worsening since then. She denies any drainage, fever or chills. Patient was admitted for Acute epididymitis and has been started on IV antibiotics. Escherichia coli, other coliforms, and Pseudomonas species are more frequent in older men, often in association with obstructive uropathy from benign prostatic hyperplasia. Plan to start on Rocephin, Levaquin and cultures was negative. Urology was consulted. Antibiotis was changed to oral Levaquin.  1. Continue antibiotics and transition to po antibiotics  2. Scrotal elevation  3. Pt to keep foley for 2 weeks  4. Pt to f/u in 2 weeks with Dr. Vickii Penna  5. Continue plan per Medicine   6. Leg bag teaching and home health referral     Physical examination on discharge day:   BP 111/58 (BP Location: Left arm, Patient Position: Lying)    Pulse 96    Temp 98.5 ??F (36.9 ??C) (Oral)    Resp 16    Ht 5' 11 (1.803 m)    Wt 133 lb 3.2 oz (60.4 kg)    SpO2 97%    BMI 18.58 kg/m??   General appearance:  Alert. Appears comfortable.  HEENT: Sclera clear. Moist mucus membranes.Trachea midline  Cardiovascular: Regular rate and rhythm, normal S1,  S2. No murmur. No edema in lower extremities.  Respiratory: Clear to auscultation bilaterally, no wheeze, good inspiratory effort  GI: Abdomen soft, non-tender, not distended, normal bowel sounds  Neurology: CN 2-12 grossly intact  Skin: Warm, dry, normal turgor    Labs and Tests:  CBC:   Recent Labs      09/15/16   0905  09/16/16   0419   WBC  17.3*  15.5*   HGB  12.0*  9.6*   PLT  355  298     BMP:    Recent Labs      09/16/16   0419   NA  135   K  5.2   CL  103   CO2  28   BUN  15   CREATININE  1.17   GLUCOSE  117*     Hepatic: No results for input(s): AST, ALT, BILITOT, ALKPHOS in the last 72 hours.    Invalid input(s): ALB    Consults: ED CONTACT PROVIDER [facility]  ED CONTACT PROVIDER [facility]  IP CONSULT TO UROLOGY    Follow Up: Donzetta Starch, MD in 1 wk   Disposition:  ECF  Activity: No limitations on physical activity.   Instructions:    Infection orders: If you get a temperature over 101F,  then  call Donzetta Starch, MD .  1. Continue antibiotics and transition to po antibiotics  2. Scrotal elevation  3. Pt to keep foley for 2 weeks  4. Pt to f/u in 2 weeks with Dr. Vickii Penna  5. Continue plan per Medicine   6. Leg bag teaching and home health referral   7. Call with questions  Diet:    Regular diet    Discharge Medications:   Cassidy, Tabet   [facility] Medication Instructions [id number]    Printed on:09/16/16 1226   Medication Information                      albuterol (PROVENTIL) 2.5 mg /3 mL (0.083 %) nebulizer solution  Inhale 2.5 mg by nebulization every 6 hours as needed.                       albuterol (PROVENTIL;VENTOLIN;PROAIR) 90 mcg/actuation inhaler  Inhale 2 puffs into the lungs every 4 hours as needed for Wheezing.             ALPRAZolam (XANAX) 1 MG tablet  Take 1 mg by mouth 2 times a day.                       ALPRAZolam (XANAX) 1 MG tablet  Take 2 mg by mouth after lunch. Between 1400-1500             diphenhydrAMINE (BENADRYL) 25 mg capsule  Take 25-50 mg by mouth if needed.                        fluticasone-salmeterol (ADVAIR) 250-50 mcg/dose diskus inhaler  Inhale 2 puffs into the lungs 2 times a day.                       food supplemt, lactose-reduced (ENSURE ORIGINAL) Liqd  Take 1 Can by mouth 3 times a day. vanilla             ibuprofen (ADVIL) 200 MG tablet  Take 400 mg by mouth if needed.                       levoFLOXacin (LEVAQUIN) 500 MG tablet  Take 1 tablet (500 mg total) by mouth daily.             oxyCODONE (ROXICODONE) 5 MG immediate release tablet  Take 1 tablet (5 mg total) by mouth every 4 hours as needed for up to 20 days.             tamsulosin (FLOMAX) 0.4 mg Cp24  Take 0.4 mg by mouth daily.             tiotropium (SPIRIVA) 18 mcg  Inhale 1 capsule (18 mcg total) into the lungs daily.                  Spent 35 minutes in discharge process    Thank you, Dr. Donzetta Starch, MD, for allowing [place] to take care of the patient [name] . The patient needs to be followed by the primary care physician, Dr. Donzetta Starch, MD.   Signed:  Edwina Barth    09/17/2016 11:25 AM

## 2016-09-17 NOTE — Unmapped (Signed)
Physical Therapy   Reason Patient Not Seen     Name: Derrick Garner  DOB: [birth year]  Attending Physician: Edwina Barth, MD  Admission Diagnosis: Epididymoorchitis [N45.3]  Cystitis [N30.90]  Date: 09/17/2016  Precautions: Precautions: fall risk, monitor vitals  Reviewed Pertinent hospital course: Yes    Unable to see patient due to: Attempted to see pt at this time for PT. Pt currently sleeping. Once awakened, he stated he needed time to wake up before he could participate. Will re-attempt later as schedule permits.    [place] [address]  Pager - [phone]  09/17/2016

## 2016-09-17 NOTE — Unmapped (Signed)
Occupational Therapy  Occupational Therapy Treatment/  Tentative D/C     Name: Derrick Garner  DOB: 1951-01-19  Attending Physician: Edwina Barth, MD  Admission Diagnosis: Epididymoorchitis [N45.3]  Cystitis [N30.90]  Date: 09/17/2016  Reviewed Pertinent hospital course: Yes   Hospital Course PT/OT: Gery Amedee??is a 66 y.o.??male??with past medical history of Severe COPD on home oxygen, anxiety, history of BPH and recurrent UTI presented with right sided epididymitis .  Precautions: fall risk, monitor vitals      Assessment  OT 6 Clicks  Help From Another Person To Put On/Take Off Regular Lower Body Clothing: A lot  Help From Another Person Bathing ( including washing ,rinsing,drying): A lot  Help From Another Person Toileting, which includes using the toilet, bedpan or urinal: A lot  Help From Another Person To Put On/Take Off Regular Upper Body Clothing: A lot  Help From Another Person Taking Care Of Personal Grooming: A little  Help From Another Person Eating Meals: None  OT 6 Clicks Score: 15  Assessment: Decreased ADL status, Decreased activity tolerance, Decreased Functional Mobility, Decreased high-level ADLs, Decreased self-care trans, Decreased Balance  Prognosis: Good, With continued OT s/p acute discharge, 24 hour supervision recommended  Goal Formulation: Patient    Goals  Pt Will demonstrate toilet transfer: Stand by assist  Pt Will demonstrate grooming task: Contact Guard (sink side)  Pt  Will demonstrate UE ADLs: Minimal (goal met 09/17/16)  Pt Will demonstrate LE ADLs: Moderate  Pt Will tolerate completeing ADLs with pain less than 4/10: .  Pt Will participate in upper extremity HEP to prep for ADLs: .  Miscellaneous Goal #1: Pt will independently recall 3/5 energy conservation techniques related to ADL routine.   Miscellaneous Goal #2: Pt will maintain SPO2 at 92% or greater during ADL tasks.   Time frame for goals to be met in: one week, by 09/23/16    Recommendation  Plan  Treatment Interventions:  ADL retraining, Functional transfer training, UE strengthening/ROM, Lower Extremity Intervention, Therapeutic Activity, Compensatory technique education, Patient/Family training, Equipment eval/education, Activity Tolerance training, Energy Conservation  Progress: Slow progress, medical status limitations  OT Frequency: 3-5x/wk  Recommendation  Recommendation: Short-term skilled OT  Equipment Recommended: Hospital bed      Problem List  Patient Active Problem List   Diagnosis   ??? SOB (shortness of breath) on exertion   ??? Epididymitis        Past Medical History  Past Medical History:   Diagnosis Date   ??? Anxiety    ??? 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 History of alcohol abuse    ??? Hypoxemia requiring supplemental oxygen    ??? Tobacco abuse         Past Surgical History  Past Surgical History:   Procedure Laterality Date   ??? COLONOSCOPY W/ BIOPSIES AND POLYPECTOMY         Cognition  Orientation Level: Oriented X4    Pain  Pain Score:   6  Pain Location: Scrotum  Pain Descriptors: Sore;Aching  Pain Intervention(s):  (educated pt on positioning techniques and offered a scrotal support system, however pt declined)  Therapist reported pain to:: RN is aware and monitoring     Mobility  Bed Mobility  Supine to Sit:  (NT, pt sitting up at EOB upon arrival)  Sit to Supine:  (NT, pt sitting up in chair at end of session)  Functional Transfers  Sit to Stand: Stand by assist  Bed  to Chair: Stand by assist  Balance  Sitting - Static: Good  Sitting - Dynamic: Good  Standing - Static: Fair  Standing - Dynamic: Fair      ADL  Where Assessed: Edge of bed  Grooming Assistance: Stand by  Grooming Deficit: Verbal cueing;Supervision/safety;Increased time to complete  UE Dressing Assistance: Minimal  UE Dressing Deficit: Verbal cueing;Supervision/safety;Increased time to complete;Thread RUE;Thread LUE;Pull around back;Pull down in back;Fasteners (to [employer] and don robe)    Vitals  Upon arrival, pt  sitting up at EOB leaning forward resting elbows on his knees. Pt's SPO2 89% on 3.5L O2 via NC and HR 111. Pt transferred to chair and HR increased to 117 and SPO2 decreased to 85% on 3.5L O2, however returned to 91% after activity with pursed lip breathing.    Tentative Discharge Summary:   Should pt discharge prior to next OT tx session, please allow this note to serve as a tentative discharge summary. 1 out of 8 goals met. No equipment issued. Goals not fully met due to brevity of admit and decreased activity tolerance d/t DOE. Recommend continued skilled OT services and 24 hour supervision/assist. Tentative discharge from acute OT to SNF. Should pt remain in hospital (at [facility]), will resume OT per POC.         Patient Education  Pt. educated on the role of OT/POC, functional transfers, benefits of scrotal support and safety with ADLs. Pt demo fair understanding of education and will require moderate reinforcement for improved comprehension.       Positioning of Patient:  Pt sitting up in chair at end of session with needs in reach and chair alarm activated.      Handoff of Care:   Safety Handoff completed with RN after session.       Time  Start Time: 1501  Stop Time: 1527  Time Calculation (min): 26 min    Charges       $Therapeutic Activity: 8-22 mins  $Self Care/ADL/Home Management Training: 8-22 mins            Cordie Grice, OTR/L  License #: [id number]  Ascom: [phone]  09/17/2016

## 2016-09-17 NOTE — Unmapped (Signed)
[facility]    Case Management Discharge Summary     Patient name: Derrick Garner                                        Patient MRN: [id number]  DOB: 06/06/1950                              Age: 66 y.o.              Gender: male  Patient emergency contact: Extended Emergency Contact Information  Primary Emergency Contact: Nazareno,Todd   [place]  Mobile Phone: [phone]  Relation: Brother  Preferred language: English  Interpreter needed? No  Secondary Emergency Contact: Helf,Colby   [place]  Mobile Phone: [phone]  Relation: Daughter  Preferred language: English  Interpreter needed? No      Attending provider: Edwina Barth, MD  Primary care physician: Donzetta Starch, MD    The MD has indicated that the patient is ready for discharge.  [name] was referred and accepted at Facility Name: [facility]  Number to Call Report: [phone].  The patient will be transported by Name of Transport Service: [facility] Contact Number: [phone] at ETA of Transport: 1700    Transfer Mode/Level of Care: Theatre stage manager (BLS)    PASARR/HENS 7000 Completed: Yes    DC Summary, AVS, prescription and COC have been faxed to facility. CM received phone call from Maralyn Sago at [facility] that they received insurance precert for patient to admit to their facility today.  She confirmed bed availability for today.    CM met with patient and spoke with Angelique Blonder, sister in law, to notify them of this information.  They both remain in agreement with this discharge plan.    The plan has been reviewed:     Patient/Family Informed of Discharge Plan: Yes    Plan Reviewed With Patient, Family, or Significant Other: Yes    Patient and or family are aware and in agreement with the discharge plan: Yes    Family Member Name and Relationship Notified at Discharge: [name] - brother   Family Contact Number: [phone]    Plan reviewed with MD and other members of the  health care team: Yes  Care Plan Completed: Yes    No further CM needs.    Jocelyn Lamer RN, CM  [phone]    This plan has been reviewed with the multi-disciplinary team.

## 2016-09-17 NOTE — Unmapped (Signed)
CONTINUITY OF CARE FORM     Patient name: Derrick Garner  Patient MRN: [id number]  DOB: November 24, 1950  Age: 66 y.o.  Gender: male    Date of admission: 09/13/2016  Date of discharge: 09/17/2016    Attending provider: Edwina Barth, MD  Primary care physician: Donzetta Starch, MD    Code status: Full Code  Allergies: No Known Allergies    Diagnoses Present on Admission   Primary Diagnosis: Epididymitis  Discharge Diagnosis :   Active Hospital Problems    Diagnosis Date Noted   ??? Epididymitis [N45.1] 09/13/2016      Resolved Hospital Problems    Diagnosis Date Noted Date Resolved   No resolved problems to display.     Prognosis: good  Rehabilitation potential: good    Diet     Diet Orders          Diet regular starting at 06/18 1528        Dysphagia Assessment and Recommendations (when available):    Dysphagia Diet Recommended (when available):      Regular Diet    Services Required   Skilled Nursing: Yes    PT Interventions and Frequency: Treatment/Interventions: LE strengthening/ROM, Endurance training, Patient/family training, Equipment eval/education, Museum/gallery curator, Therapeutic Exercise, Therapeutic Activity, Continued evaluation, Compensatory technique education, Gait training, Neuromuscular Reeducation  PT Frequency: 3-5x/wk    PT Recommendations: Recommendation: Short-term skilled PT    OT Interventions and Frequency: Treatment Interventions: ADL retraining, Functional transfer training, UE strengthening/ROM, Lower Extremity Intervention, Therapeutic Activity, Compensatory technique education, Patient/Family training, Equipment eval/education, Activity Tolerance training, Energy Conservation  Progress: Slow progress, medical status limitations  OT Frequency: 3-5x/wk    OT Recommendations: Recommendation: Short-term skilled OT  Equipment Recommended: Hospital bed    Weight bearing status:  full    Bedside Swallow Recommendations (when available):      Speech Language Recommendations (when available):      Needs  24 hour supervision due to cognitive impairment: No    Discharge Medications   Medications:     Medication List      TAKE these medications, which are NEW      Quantity/Refills   levoFLOXacin 500 MG tablet  Commonly known as:  LEVAQUIN  Take 1 tablet (500 mg total) by mouth daily.   Quantity:  14 tablet  Refills:  0     oxyCODONE 5 MG immediate release tablet  Commonly known as:  ROXICODONE  Take 1 tablet (5 mg total) by mouth every 4 hours as needed for up to 20 days.   Quantity:  30 tablet  Refills:  0        TAKE these medications, which you were ALREADY TAKING      Quantity/Refills   ADVIL 200 MG tablet  Generic drug:  ibuprofen  Take 400 mg by mouth if needed.   Refills:  0     * albuterol 2.5 mg /3 mL (0.083 %) nebulizer solution  Commonly known as:  PROVENTIL  Inhale 2.5 mg by nebulization every 6 hours as needed.   Refills:  0     * albuterol 90 mcg/actuation inhaler  Commonly known as:  PROVENTIL;VENTOLIN;PROAIR  Inhale 2 puffs into the lungs every 4 hours as needed for Wheezing.   Quantity:  1 Inhaler  Refills:  11     * ALPRAZolam 1 MG tablet  Commonly known as:  XANAX  Take 2 mg by mouth after lunch. Between 1400-1500   Refills:  0     *  ALPRAZolam 1 MG tablet  Commonly known as:  XANAX  Take 1 mg by mouth 2 times a day.   Refills:  0     BENADRYL 25 mg capsule  Generic drug:  diphenhydrAMINE  Take 25-50 mg by mouth if needed.   Refills:  0     ENSURE ORIGINAL Liqd  Generic drug:  food supplemt, lactose-reduced  Take 1 Can by mouth 3 times a day. vanilla   Refills:  0     fluticasone-salmeterol 250-50 mcg/dose diskus inhaler  Commonly known as:  ADVAIR  Inhale 2 puffs into the lungs 2 times a day.   Refills:  0     tamsulosin 0.4 mg Cp24  Commonly known as:  FLOMAX  Take 0.4 mg by mouth daily.   Refills:  0     tiotropium 18 mcg  Commonly known as:  SPIRIVA  Inhale 1 capsule (18 mcg total) into the lungs daily.   Quantity:  30 capsule  Refills:  6        * This list has 4 medication(s) that are the same  as other medications prescribed for you. Read the directions carefully, and ask your doctor or other care provider to review them with you.               Where to Get Your Medications      These medications were sent to [facility] [address] [place] - [address]  [address], [place] [address]    Phone:  [phone]   ?? levoFLOXacin 500 MG tablet     You can get these medications from any pharmacy    Bring a paper prescription for each of these medications  ?? oxyCODONE 5 MG immediate release tablet               Discharge Specific Orders   Discharge specific orders:  1. Continue oral antibiotics  2. Scrotal elevation  3. Pt to keep foley for 2 weeks  4. Pt to f/u in 2 weeks with Dr. Vickii Penna  5. Continue plan per Medicine   6. Leg bag teaching and home health referral     Isolation         Physician Certification of Medically Necessary Transportation   Type and reason for transportation: Wheelchair - patient has difficulty walking.  Reason for transport to another facility: Skilled nursing facility requirements  Patient requires: Continuous medical supervision enroute    Follow-up Appointments and [facility] Discharge Physician Name   Future Appointments  Date Time Provider Department Center   10/04/2016 8:45 AM Gillis Ends [facility] URO [facility]   10/27/2016 12:15 PM [facility] ECHO [facility] ECHO [facility]   10/27/2016 2:00 PM NORTH CT [facility] CT [facility]     No follow-up provider specified.  Follow up with their PCP as scheduled.  No Follow-up on file.    Physician Signature and Credentials   I certify that I have reviewed the information contained herein, and that the information is a true and accurate reflection of the individual's condition.    Discharging Physician: Electronically signed by Edwina Barth, MD  09/17/2016, 11:25 AM    SOCIAL WORK DOCUMENTATION     Facility/Agency Name:      Number to call report:      Level of Care at Discharge:             Less than 30 day convalescent stay:  PASARR/HENS 7000 Completed:      Family Member Name and Relationship Notified at Discharge:      Family Contact Number:      Social Worker Name and Telephone Number:       or  Discharge Planner Name and Telephone Number:      NURSE DISCHARGE ASSESSMENT   Vitals:  Patient Vitals for the past 4 hrs:   BP Temp Temp src Pulse Resp SpO2   09/17/16 0813 - - - - - 97 %   09/17/16 0751 111/58 98.5 ??F (36.9 ??C) Oral 96 16 96 %        Orientation:       Orientation Level: Oriented X4  Patient Behaviors: Calm, Cooperative    Respiratory:  Respiratory (WDL): Exceptions to WDL  Respiratory Pattern: Regular, Unlabored  Chest Assessment: Chest expansion symmetrical  Bilateral Breath Sounds: Clear, Diminished  R Breath Sounds: Clear, Diminished  L Breath Sounds: Clear, Diminished      Cardiac:  Cardiac (WDL): Within Defined Limits    Edema:  Peripheral Vascular (WDL): Exceptions to WDL  Edema: Perineal  LUE Edema: Non Pitting Edema  RLE Edema: Non Pitting Edema  LLE Edema: Non Pitting Edema  Perineal: Mild pitting, slight indentation  Sacral: Mild pitting, slight indentation    Wounds:       Comfort/Mattress:       Musculoskeletal:  Musculoskeletal (WDL): Within Defined Limits    GI:  Gastrointestinal (WDL): Exceptions to WDL  GI Symptoms: Constipation  Last BM Date: 09/11/16  Bowel Incontinence: No  Stool Source: Rectum    GU:  Genitourinary (WDL): Exceptions to WDL  Genitourinary Symptoms: Other (Comment) (Foley Catheter in place)  Urinary Incontinence: No  Urine Source: Urine Catheter    Lines and Drains:  Patient Lines/Drains/Airways Status    Active Line / PIV Line     Name:   Placement date:   Placement time:   Site:   Days:    Peripheral IV 09/17/16 Left Hand  09/17/16    0612    Hand    less than 1                ADL's:  Level of Assistance: Standby assist, set-up cues, supervision of patient - no hands on  Feeding: Able to feed self  Level of Assistance: Minimal assist    Morse Fall Risk  Morse Fall Risk Score:  30    Restraints:       RN to RN Handoff:  RN Giving Report:: Psychiatrist Receiving Report:: Mardella Layman  Reason for Handoff: Shift Change      Nurse and Credentials   RN Handoff Completed by: Diannia Ruder on 09/17/2016  1. Continue antibiotics  2. Scrotal elevation  3. Pt to keep foley for 2 weeks  4. Pt to f/u in 2 weeks with Dr. Vickii Penna  5. Continue plan per Medicine

## 2016-09-17 NOTE — Unmapped (Signed)
Urology Progress Note    Name: Derrick Garner  Admit Date: 09/13/2016  OR Date:     Subjective:   Patient seen and examined this morning. No acute events over night. Tmax 98.8. Labs stable and WBC trending downward. Foley in place and hematuria resolved. Pt notes right testes pain is improving.     Objective:   Vitals:  Temp:  [98.2 ??F (36.8 ??C)-98.8 ??F (37.1 ??C)] 98.5 ??F (36.9 ??C)  Heart Rate:  [96-120] 96  Resp:  [16-22] 16  BP: (96-125)/(52-60) 111/58    Intake/Output:    Date 09/16/16 0700 - 09/17/16 0659 09/17/16 0700 - 09/18/16 0659   Shift 0700-1459 1500-2259 2300-0659 24 Hour Total 0700-1459 1500-2259 2300-0659 24 Hour Total   I  N  T  A  K  E   P.O. 480 240 60 780          P.O. 480 240 60 780        I.V.  (mL/kg)  1980.7  (32.6)  1980.7  (32.8) 1137  (18.8)   1137  (18.8)      Volume (mL) (sodium chloride 0.9 % infusion)  1980.7  1980.7 1137   1137    IV Piggyback  100  100          Volume (mL) (levoFLOXacin (LEVAQUIN) 500 mg in D5W 100 mL IVPB)  100  100        Shift Total  (mL/kg) 480  (7.9) 2320.7  (38.2) 60  (1) 2860.7  (47.3) 1137  (18.8)   1137  (18.8)   O  U  T  P  U  T   Urine  (mL/kg/hr)  450  (0.9) 400  (0.8) 850  (0.6) 450   450      Urine   300 300          Output (mL) (IUC (Foley) 16 Fr.)  450 100 550 450   450    Shift Total  (mL/kg)  450  (7.4) 400  (6.6) 850  (14.1) 450  (7.4)   450  (7.4)   Weight (kg) 60.7 60.7 60.4 60.4 60.4 60.4 60.4 60.4       Physical Exam:  Gen: Alert and oriented x3, no acute distress  HEENT: NCAT  CV: Regular rate and rhythm  Resp: No respiratory distress  Abd: Soft, non-distended, non-tender  Ext: Warm and well perfused  GU: Foley in place urine amber and clear, Left teste wnl, Right teste swelling improving but still tender to touch     Labs:  Recent Labs      09/15/16   0905  09/16/16   0419   WBC  17.3*  15.5*   HGB  12.0*  9.6*   HCT  35.4*  28.1*   PLT  355  298     Recent Labs      09/16/16   0419   NA  135   K  5.2   CL  103   CO2  28   BUN  15   CREATININE   1.17   GLUCOSE  117*   CALCIUM  8.0*       Current Medications:  Scheduled Meds:   ??? docusate sodium  200 mg Oral BID   ??? heparin (porcine)  5,000 Units Subcutaneous 3 times per day   ??? levoFLOXacin  500 mg Oral Daily 0900   ??? mometasone-formoterol  2 puff Inhalation RT Q12H   ??? tamsulosin  0.4  mg Oral Daily 0900   ??? umeclidinium  62.5 mcg Inhalation RT Daily       Continuous Infusions:   ??? sodium chloride 0.9 % 100 mL/hr (09/17/16 0824)       PRN Meds: acetaminophen, albuterol, albuterol, ALPRAZolam **OR** ALPRAZolam, aluminum & magnesium hydroxide-simethicone, ibuprofen, magnesium hydroxide, ondansetron **OR** ondansetron, oxyCODONE **OR** oxyCODONE    Assessment/Plan:   Derrick Garner is a 66 y.o. male with Right epididymal orchitis and resolved hematuria     1. Continue antibiotics   2. Scrotal elevation  3. Pt to keep foley for 2 weeks  4. Pt to f/u in 2 weeks with Dr. Vickii Penna  5. Continue plan per Medicine   6. Leg bag teaching OK to d/c from GU standpoint   7. Call with questions    Discussed with staff     Gretel Acre. Vickey Sages, CNP  Urology  Pager: [phone]

## 2016-09-17 NOTE — Plan of Care (Signed)
Pt refusing heparin after being educated on the importance of VTE prevention.  Still refusing.

## 2016-09-27 ENCOUNTER — Ambulatory Visit: Admit: 2016-09-27 | Payer: Medicare (Managed Care)

## 2016-09-27 DIAGNOSIS — N401 Enlarged prostate with lower urinary tract symptoms: Secondary | ICD-10-CM

## 2016-09-27 NOTE — Unmapped (Signed)
Spoke with patient today about starting pulmonary rehab, he stated he is still in a rehab facility and will call when he gets home.    Jannette Cotham Louie Casa RRT

## 2016-09-27 NOTE — Unmapped (Signed)
Chief Complaint   Patient presents with   ??? Follow-up     voiding trail           History of Present Illness  Derrick Garner presents as a followup from [facility]. He was seen for severe epididymoorchitis causing systemic symptoms as well as urinary retention with >1L in bladder on bladder scan. Foley catheter placed and patient eventually discharged to home on oral levaquin with the foley. His medical history is significant for severe COPD, BPH, recurrent UTIs, and elevated PSA. He is on home oxygen. His testicular pain is improved, testicular swelling better but not resolved. Here for voiding trial.     Previously he had recurrent UTIs with urinary frequency and incontinence. Suspect overflow incontinence. Patient reports a history of TURP ~2 years ago.        Review of Systems   Constitutional: Negative for activity change, appetite change, chills, diaphoresis, fatigue and fever.   Respiratory: Negative for apnea, cough, choking, chest tightness, shortness of breath, wheezing and stridor.    Cardiovascular: Negative for chest pain, palpitations and leg swelling.   Gastrointestinal: Negative for abdominal distention, abdominal pain, anal bleeding, bloating, blood in stool, constipation, diarrhea, heartburn, nausea, rectal pain and vomiting.   Genitourinary: Negative for discharge, flank pain, hematuria, penile pain and penile swelling.       Allergies  Patient has no known allergies.    Medications  Outpatient Encounter Prescriptions as of 09/27/2016   Medication Sig Dispense Refill   ??? albuterol (PROVENTIL) 2.5 mg /3 mL (0.083 %) nebulizer solution Inhale 2.5 mg by nebulization every 6 hours as needed.               ??? albuterol (PROVENTIL;VENTOLIN;PROAIR) 90 mcg/actuation inhaler Inhale 2 puffs into the lungs every 4 hours as needed for Wheezing. 1 Inhaler 11   ??? ALPRAZolam (XANAX) 1 MG tablet Take 1 mg by mouth 2 times a day.               ??? ALPRAZolam (XANAX) 1 MG tablet Take 2 mg by mouth after lunch.  Between 1400-1500     ??? ALPRAZolam (XANAX) 1 MG tablet Take 1 tablet (1 mg total) by mouth 3 times a day as needed for Anxiety (1 mg in am, 2mg  at bedtime) for up to 10 days. 20 tablet 0   ??? diphenhydrAMINE (BENADRYL) 25 mg capsule Take 25-50 mg by mouth if needed.               ??? fluticasone-salmeterol (ADVAIR) 250-50 mcg/dose diskus inhaler Inhale 2 puffs into the lungs 2 times a day.               ??? food supplemt, lactose-reduced (ENSURE ORIGINAL) Liqd Take 1 Can by mouth 3 times a day. vanilla     ??? ibuprofen (ADVIL) 200 MG tablet Take 400 mg by mouth if needed.               ??? levoFLOXacin (LEVAQUIN) 500 MG tablet Take 1 tablet (500 mg total) by mouth daily. 14 tablet 0   ??? oxyCODONE (ROXICODONE) 5 MG immediate release tablet Take 1 tablet (5 mg total) by mouth every 4 hours as needed for up to 20 days. 30 tablet 0   ??? tamsulosin (FLOMAX) 0.4 mg Cp24 Take 0.4 mg by mouth daily.     ??? tiotropium (SPIRIVA) 18 mcg Inhale 1 capsule (18 mcg total) into the lungs daily. 30 capsule 6  No facility-administered encounter medications on file as of 09/27/2016.         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    He has a past surgical history that includes Colonoscopy w/ biopsies and polypectomy.    His family history includes COPD in his maternal uncle; Cancer in his father; Lung Cancer in his father.    He reports that he quit smoking about 9 months ago. His smoking use included Cigarettes and Cigars. He has a 67.50 pack-year smoking history. He has never used smokeless tobacco. He reports that he has current or past drug history, including Marijuana. He reports that he does not drink alcohol.    The following portions of the patient's history were reviewed and updated as appropriate: allergies, current medications, past family history, past medical  history, past social history, past surgical history and problem list.    Blood pressure 120/80, pulse 114, height 5' 11 (1.803 m), weight 124 lb (56.2 kg), SpO2 92 %.  Physical Exam   Constitutional: He is oriented to person, place, and time. He appears well-developed and well-nourished.   Very thin male    HENT:   Head: Normocephalic and atraumatic.   Eyes: Conjunctivae and EOM are normal.   Neck: Normal range of motion.   Pulmonary/Chest: Effort normal. No respiratory distress.   Increased work of breathing with long sentences or exertion    Abdominal: Soft. He exhibits no distension. There is no tenderness.   Genitourinary: Testes normal and penis normal. Right testis shows no mass and no tenderness. Left testis shows no mass and no tenderness. No penile tenderness.   Musculoskeletal: Normal range of motion. He exhibits no edema.   Neurological: He is alert and oriented to person, place, and time. No cranial nerve deficit.   Skin: Skin is warm and dry.   Psychiatric: He has a normal mood and affect. His behavior is normal. Judgment and thought content normal.            Assessment  65yM with retention, UTI, epididymoorchitis, here for voiding trial     Plan  1. Fill and pull performed with >280ml instilled into bladder. Patient unable to void so he was allowed to take a walk around for 40 minutes. Still unable to void. Offered CIC vs foley replacement, patient elected to have foley replaced.     2. Will plan on cystoscopy and repeat voiding trial in 3 weeks. If regrowth of adenoma can consider TUR, if no significant regrowth will plan on urodynamic studies to evaluate for NGB.    3. Continue levaquin until course completed          Medical Decision Making  The following items were considered in medical decision making:  Review / order clinical lab tests  Review / order other diagnostic tests/interventions  Reviewed outside records

## 2016-10-11 MED ORDER — tamsulosin (FLOMAX) 0.4 mg Cap
0.4 | ORAL_CAPSULE | ORAL | 3 refills | Status: AC
Start: 2016-10-11 — End: ?

## 2016-10-11 NOTE — Unmapped (Signed)
Please sign and send pending RX to selected pharmacy. Pt last seen on 09/27/16.

## 2016-10-18 ENCOUNTER — Ambulatory Visit: Admit: 2016-10-18 | Payer: Medicare (Managed Care)

## 2016-10-18 DIAGNOSIS — N210 Calculus in bladder: Secondary | ICD-10-CM

## 2016-10-18 MED ORDER — levoFLOXacin (LEVAQUIN) 500 MG tablet
500 | ORAL_TABLET | Freq: Every day | ORAL | 0 refills | Status: AC
Start: 2016-10-18 — End: 2016-11-07

## 2016-10-18 MED ORDER — ciprofloxacin HCl (CIPRO) tablet 500 mg
500 | Freq: Once | ORAL | Status: AC
Start: 2016-10-18 — End: 2016-10-18
  Administered 2016-10-18: 20:00:00 500 mg via ORAL

## 2016-10-18 MED ORDER — lidocaine HCl (XYLOCAINE) 2 % JelP 10 mL
2 | Freq: Once | Status: AC
Start: 2016-10-18 — End: 2016-10-18
  Administered 2016-10-18: 20:00:00 10 mL

## 2016-10-18 NOTE — Unmapped (Signed)
Chief Complaint   Patient presents with   ??? Follow-up          History of Present Illness  [name] returns for follow up for his retention. Cystoscopy performed which demonstrated several bladder stones, a large median lobe, regrowth of the adenoma, concerning edema and changes in his bladder, and significant trabeculation. Wanted to discuss options on how to proceed next so sat with him and brother for formal office visit. He has no new complaints. He again failed his voiding trial and had to have the foley catheter replaced. He endorses that his bladder has not been working well for years. He has significant COPD, elevated PSA at 10, and recurrent UTIs. His brother also reports a history of elevated PSA, negative on biopsy.     Prior note:   [name] presents as a followup from [facility]. He was seen for severe epididymoorchitis causing systemic symptoms as well as urinary retention with >1L in bladder on bladder scan. Foley catheter placed and patient eventually discharged to home on oral levaquin with the foley. His medical history is significant for severe COPD, BPH, recurrent UTIs, and elevated PSA. He is on home oxygen. His testicular pain is improved, testicular swelling better but not resolved. Here for voiding trial.     Previously he had recurrent UTIs with urinary frequency and incontinence. Suspect overflow incontinence. Patient reports a history of TURP ~2 years ago.        Review of Systems   Constitutional: Negative for activity change, appetite change, chills, diaphoresis, fatigue and fever.   Respiratory: Negative for apnea, cough, choking, chest tightness, shortness of breath, wheezing and stridor.    Cardiovascular: Negative for chest pain, palpitations and leg swelling.   Gastrointestinal: Negative for abdominal distention, abdominal pain, anal bleeding, bloating, blood in stool, constipation, diarrhea, heartburn, nausea, rectal pain and vomiting.   Genitourinary: Negative for  discharge, flank pain, hematuria, penile pain and penile swelling.       Allergies  Patient has no known allergies.    Medications  Outpatient Encounter Prescriptions as of 10/18/2016   Medication Sig Dispense Refill   ??? albuterol (PROVENTIL) 2.5 mg /3 mL (0.083 %) nebulizer solution Inhale 2.5 mg by nebulization every 6 hours as needed.               ??? albuterol (PROVENTIL;VENTOLIN;PROAIR) 90 mcg/actuation inhaler Inhale 2 puffs into the lungs every 4 hours as needed for Wheezing. 1 Inhaler 11   ??? ALPRAZolam (XANAX) 1 MG tablet Take 1 mg by mouth 2 times a day.               ??? ALPRAZolam (XANAX) 1 MG tablet Take 2 mg by mouth after lunch. Between 1400-1500     ??? diphenhydrAMINE (BENADRYL) 25 mg capsule Take 25-50 mg by mouth if needed.               ??? fluticasone-salmeterol (ADVAIR) 250-50 mcg/dose diskus inhaler Inhale 2 puffs into the lungs 2 times a day.               ??? food supplemt, lactose-reduced (ENSURE ORIGINAL) Liqd Take 1 Can by mouth 3 times a day. vanilla     ??? ibuprofen (ADVIL) 200 MG tablet Take 400 mg by mouth if needed.               ??? levoFLOXacin (LEVAQUIN) 500 MG tablet Take 1 tablet (500 mg total) by mouth daily. 14 tablet 0   ???   tamsulosin (FLOMAX) 0.4 mg Cap TAKE ONE CAPSULE BY MOUTH ONCE DAILY AT BEDTIME 30 capsule 3   ??? tamsulosin (FLOMAX) 0.4 mg Cp24 Take 0.4 mg by mouth daily.     ??? tiotropium (SPIRIVA) 18 mcg Inhale 1 capsule (18 mcg total) into the lungs daily. 30 capsule 6   ??? levoFLOXacin (LEVAQUIN) 500 MG tablet Take 1 tablet (500 mg total) by mouth daily. Start taking ONE WEEK prior to your surgery 7 tablet 0     Facility-Administered Encounter Medications as of 10/18/2016   Medication Dose Route Frequency Provider Last Rate Last Dose   ??? ciprofloxacin HCl (CIPRO) tablet 500 mg  500 mg Oral Once Korinna Tat       ??? 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    He has a past surgical history that includes Colonoscopy w/ biopsies and polypectomy.    His family history includes COPD in his maternal uncle; Cancer in his father; Lung Cancer in his father.    He reports that he quit smoking about 9 months ago. His smoking use included Cigarettes and Cigars. He has a 67.50 pack-year smoking history. He has never used smokeless tobacco. He reports that he has current or past drug history, including Marijuana. He reports that he does not drink alcohol.    The following portions of the patient's history were reviewed and updated as appropriate: allergies, current medications, past family history, past medical history, past social history, past surgical history and problem list.    Pulse 102, height 5' 11 (1.803 m), weight 121 lb (54.9 kg), SpO2 96 %.  Physical Exam   Constitutional: He is oriented to person, place, and time. He appears well-developed and well-nourished.   Very thin male    HENT:   Head: Normocephalic and atraumatic.   Eyes: Conjunctivae and EOM are normal.   Neck: Normal range of motion.   Pulmonary/Chest: Effort normal. No respiratory distress.   Increased work of breathing with long sentences or exertion    Abdominal: Soft. He exhibits no distension. There is no tenderness.   Genitourinary: Testes normal and penis normal. Right testis shows no mass and no tenderness. Left testis shows no mass and no tenderness. No penile tenderness.   Musculoskeletal: Normal range of motion. He exhibits no edema.   Neurological: He is alert and oriented to person, place, and time. No cranial nerve deficit.   Skin: Skin is warm and dry.   Psychiatric: He has a normal mood and affect. His behavior is normal. Judgment and thought content normal.            Assessment  65yM with retention, recurrent UTI, bladder stones, regrowth of adenoma,  elevated PSA.     Plan  1. Fill and pull performed with >500ml instilled into bladder. Patient unable to void so foley replaced.     2. Significant regrowth of adenoma, several bladder stones, significant calcification of his median lobe, he will need surgery to remove the stones and will TUR him at that time. Discussed that he is a high risk patient, would prefer to do him under spinal anesthesia to decrease risk with his COPD. Discussed that there was a high likelihood that   I would not be able to treat all the adenoma but I would remove the stones, TUR as much as I could in a reasonable time frame, and biopsy the bladder wall that appeared not quite consistent with just edema, although I suspect this is just chronic irritation from the stones. Discussed at length that I suspect he has a neurogenic bladder and this surgery may not resolve his retention issues.   All questions answered, will have a level C visit with anesthesia and plan procedure downtown.     3. Restart levaquin one week prior to procedure        Medical Decision Making  The following items were considered in medical decision making:  Review / order clinical lab tests  Review / order other diagnostic tests/interventions  Reviewed outside records

## 2016-10-18 NOTE — Progress Notes (Signed)
Chief Complaint   Patient presents with    Follow-up          History of Present Illness  Derrick Garner returns for follow up for his retention. Cystoscopy performed which demonstrated several bladder stones, a large median lobe, regrowth of the adenoma, concerning edema and changes in his bladder, and significant trabeculation. Wanted to discuss options on how to proceed next so sat with him and brother for formal office visit. He has no new complaints. He again failed his voiding trial and had to have the foley catheter replaced. He endorses that his bladder has not been working well for years. He has significant COPD, elevated PSA at 10, and recurrent UTIs. His brother also reports a history of elevated PSA, negative on biopsy.     Prior note:   Derrick Garner presents as a followup from [facility]. He was seen for severe epididymoorchitis causing systemic symptoms as well as urinary retention with >1L in bladder on bladder scan. Foley catheter placed and patient eventually discharged to home on oral levaquin with the foley. His medical history is significant for severe COPD, BPH, recurrent UTIs, and elevated PSA. He is on home oxygen. His testicular pain is improved, testicular swelling better but not resolved. Here for voiding trial.     Previously he had recurrent UTIs with urinary frequency and incontinence. Suspect overflow incontinence. Patient reports a history of TURP ~2 years ago.        Review of Systems   Constitutional: Negative for activity change, appetite change, chills, diaphoresis, fatigue and fever.   Respiratory: Negative for apnea, cough, choking, chest tightness, shortness of breath, wheezing and stridor.    Cardiovascular: Negative for chest pain, palpitations and leg swelling.   Gastrointestinal: Negative for abdominal distention, abdominal pain, anal bleeding, bloating, blood in stool, constipation, diarrhea, heartburn, nausea, rectal pain and vomiting.   Genitourinary: Negative for  discharge, flank pain, hematuria, penile pain and penile swelling.       Allergies  Patient has no known allergies.    Medications  Outpatient Encounter Prescriptions as of 10/18/2016   Medication Sig Dispense Refill    albuterol (PROVENTIL) 2.5 mg /3 mL (0.083 %) nebulizer solution Inhale 2.5 mg by nebulization every 6 hours as needed.                albuterol (PROVENTIL;VENTOLIN;PROAIR) 90 mcg/actuation inhaler Inhale 2 puffs into the lungs every 4 hours as needed for Wheezing. 1 Inhaler 11    ALPRAZolam (XANAX) 1 MG tablet Take 1 mg by mouth 2 times a day.                ALPRAZolam (XANAX) 1 MG tablet Take 2 mg by mouth after lunch. Between 1400-1500      diphenhydrAMINE (BENADRYL) 25 mg capsule Take 25-50 mg by mouth if needed.                fluticasone-salmeterol (ADVAIR) 250-50 mcg/dose diskus inhaler Inhale 2 puffs into the lungs 2 times a day.                food supplemt, lactose-reduced (ENSURE ORIGINAL) Liqd Take 1 Can by mouth 3 times a day. vanilla      ibuprofen (ADVIL) 200 MG tablet Take 400 mg by mouth if needed.                levoFLOXacin (LEVAQUIN) 500 MG tablet Take 1 tablet (500 mg total) by mouth daily. 14 tablet 0  tamsulosin (FLOMAX) 0.4 mg Cap TAKE ONE CAPSULE BY MOUTH ONCE DAILY AT BEDTIME 30 capsule 3    tamsulosin (FLOMAX) 0.4 mg Cp24 Take 0.4 mg by mouth daily.      tiotropium (SPIRIVA) 18 mcg Inhale 1 capsule (18 mcg total) into the lungs daily. 30 capsule 6    levoFLOXacin (LEVAQUIN) 500 MG tablet Take 1 tablet (500 mg total) by mouth daily. 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    He has a past surgical history that includes Colonoscopy w/ biopsies and polypectomy.    His family history includes COPD in his maternal uncle; Cancer in his father; Lung Cancer in his father.    He reports that he quit smoking about 9 months ago. His smoking use included Cigarettes and Cigars. He has a 67.50 pack-year smoking history. He has never used smokeless tobacco. He reports that he has current or past drug history, including Marijuana. He reports that he does not drink alcohol.    The following portions of the patient's history were reviewed and updated as appropriate: allergies, current medications, past family history, past medical history, past social history, past surgical history and problem list.    Pulse 102, height 5' 11 (1.803 m), weight 121 lb (54.9 kg), SpO2 96 %.  Physical Exam   Constitutional: He is oriented to person, place, and time. He appears well-developed and well-nourished.   Very thin male    HENT:   Head: Normocephalic and atraumatic.   Eyes: Conjunctivae and EOM are normal.   Neck: Normal range of motion.   Pulmonary/Chest: Effort normal. No respiratory distress.   Increased work of breathing with long sentences or exertion    Abdominal: Soft. He exhibits no distension. There is no tenderness.   Genitourinary: Testes normal and penis normal. Right testis shows no mass and no tenderness. Left testis shows no mass and no tenderness. No penile tenderness.   Musculoskeletal: Normal range of motion. He exhibits no edema.   Neurological: He is alert and oriented to person, place, and time. No cranial nerve deficit.   Skin: Skin is warm and dry.   Psychiatric: He has a normal mood and affect. His behavior is normal. Judgment and thought content normal.            Assessment  65yM with retention, recurrent UTI, bladder stones, regrowth of adenoma,  elevated PSA.     Plan  1. Fill and pull performed with >580ml instilled into bladder. Patient unable to void so foley replaced.     2. Significant regrowth of adenoma, several bladder stones, significant calcification of his median lobe, he will need surgery to remove the stones and will TUR him at that time. Discussed that he is a high risk patient, would prefer to do him under spinal anesthesia to decrease risk with his COPD. Discussed that there was a high likelihood that  I would not be able to treat all the adenoma but I would remove the stones, TUR as much as I could in a reasonable time frame, and biopsy the bladder wall that appeared not quite consistent with just edema, although I suspect this is just chronic irritation from the stones. Discussed at length that I suspect he has a neurogenic bladder and this surgery may not resolve his retention issues.   All questions answered, will have a level C visit with anesthesia and plan procedure downtown.     3. Restart levaquin one week prior to procedure        Medical Decision Making  The following items were considered in medical decision making:  Review / order clinical lab tests  Review / order other diagnostic tests/interventions  Reviewed outside records

## 2016-10-18 NOTE — Unmapped (Signed)
Procedure Performed:  Cystoscopy     Preoperative Diagnosis:   Hematuria, Recurrent UTIs, BPH    Brief Clinical History:   Mr. Weissinger is a pleasant 66 y.o. male presenting with urinary retention, history of TURP for BPH, recurrent UTIs. He has failed many voiding trials. He is here for cystoscopy to complete his workup.     Physician:     Gillis Ends    Procedure in Detail:     The patient signed an informed consent.  Prophylactic antibiotics were administered.  The patient was placed in the supine position.  Genitalia was prepped and draped in the usual sterile fashion.  Intraurethral anesthetic was administered.   Flexible cystoscopy was performed today.      Flexible cystoscope inserted into bladder per urethra. Urethral stricture was absent. Prostate is enlarged with large median lobe and bilobar hyperplasia. Of note, his median lobe is covered by a large, flat stone, and there are multiple bladder stones in the bladder. There is edema and irritation noted though out the bladder. Significant bleeding started from scope insertion and unable to visualize the UOs. Patient is s/p TURP with regrowth of adenoma.  There is moderate trabeculation. No bladder diverticulum noted. No bladder turmor noted but significant edema along the bladder wall with enough changes that there will be a biopsy obtained from this area at time of stone removal.  Cystoscope removed.    The patient appears to not empty completely.  Post void residual was 450 ml.     Therapeutic Plan:     TURP with cystolithalopaxy    Disposition:     The patient tolerated the procedure well and without side effects.

## 2016-10-27 ENCOUNTER — Ambulatory Visit: Payer: Medicare (Managed Care)

## 2016-10-27 ENCOUNTER — Inpatient Hospital Stay: Payer: Medicare (Managed Care) | Attending: Acute Care

## 2016-10-28 ENCOUNTER — Ambulatory Visit: Admit: 2016-10-28 | Discharge: 2016-12-31 | Payer: Medicare (Managed Care)

## 2016-10-28 DIAGNOSIS — N210 Calculus in bladder: Secondary | ICD-10-CM

## 2016-10-28 LAB — CBC
Hematocrit: 41.9 % (ref 38.5–50.0)
Hemoglobin: 13.7 g/dL (ref 13.2–17.1)
MCH: 29.4 pg (ref 27.0–33.0)
MCHC: 32.6 g/dL (ref 32.0–36.0)
MCV: 90.2 fL (ref 80.0–100.0)
MPV: 7.4 fL — ABNORMAL LOW (ref 7.5–11.5)
Platelets: 285 10E3/uL (ref 140–400)
RBC: 4.65 10E6/uL (ref 4.20–5.80)
RDW: 14.2 % (ref 11.0–15.0)
WBC: 10.8 10E3/uL (ref 3.8–10.8)

## 2016-10-28 LAB — BASIC METABOLIC PANEL
Anion Gap: 7 mmol/L (ref 3–16)
BUN: 13 mg/dL (ref 7–25)
CO2: 35 mmol/L — ABNORMAL HIGH (ref 21–33)
Calcium: 10 mg/dL (ref 8.6–10.3)
Chloride: 97 mmol/L — ABNORMAL LOW (ref 98–110)
Creatinine: 0.96 mg/dL (ref 0.60–1.30)
Glucose: 96 mg/dL (ref 70–100)
Osmolality, Calculated: 288 mosm/kg (ref 278–305)
Potassium: 5 mmol/L (ref 3.5–5.3)
Sodium: 139 mmol/L (ref 133–146)
eGFR AA CKD-EPI: >90 See note.
eGFR NONAA CKD-EPI: 82 See note.

## 2016-10-28 LAB — ABO/RH: Rh Type: POSITIVE

## 2016-10-28 LAB — ANTIBODY SCREEN: Antibody Screen: NEGATIVE

## 2016-10-28 NOTE — Patient Instructions (Addendum)
Pre-Procedure Instructions    Were pleased that you have chosen [facility] for your upcoming procedure.  The staff serving you is professionally trained to provide the highest quality care.  We encourage you to ask questions and to let the staff know your special needs.  We want your visit to be as comfortable as possible.    Your procedure is scheduled on 11/05/2016.  The surgeon will notify you of time of surgery.     DO NOT EAT OR DRINK ANYTHING (including gum, mints, water, etc.) after midnight the night before your procedure.  You may brush your teeth and gargle on the morning of surgery, but do not swallow any water.  You may take the following medications with a small sip of water:     Inhalers   Alprazolam if neded   Please make transportation arrangements and bring a responsible adult to accompany you home and remain with you for 24 hours.   Leave valuables (money, jewelry, credit cards) at home.  If you wear glasses or contacts, bring a case for safekeeping.   Wear casual, loose fitting, and comfortable clothing.  A gown will be provided.  If you are staying overnight, bring a small overnight bag.  (Storage space is limited.)   Please remove all makeup, jewelry, body piercings, powder, perfume, and nail polish before you arrive.   Bring a list of your medications and dose including herbal and over-the-counter medications.  Do not bring any pills or medications to the hospital. (Exception: transplant patients.)   Bring a photo ID and your insurance card so we can bill your insurance company directly.   Please do not bring any children under the age of 30 to the hospital.   Do not shave in the area of the surgery for 2 days prior to surgery.  If needed, a trained staff member will clip the area immediately before your surgery.   Quit smoking as far in advance of surgery as possible.  Patients who quit at least 30 days before surgery may have better outcomes.   If you are diabetic,  pay close attention to your blood sugar and try to keep it in the range your doctor wants it to be in.  If you have low blood sugar prior to coming in to the hospital you may drink a small amount of CLEAR SUGAR-CONTAINING FLUIDS WITHOUT PULP SUCH AS APPLE JUICE OR CLEAR SODA.  Depending on the procedure you are having this could impact the timing of your surgery.   Discuss discontinuing herbal medications with your doctor before surgery.   Please shower at home the evening before and the morning of surgery using an antiseptic agent, if provided, or soap and water.   If you suspect that you have an infection prior to surgery, contact your doctor and surgeon prior to surgery.  Also tell the pre-surgery nurse on the day of surgery.    Additional instructions:Stop all aspirin, ibuprofen and vitamin products 1 week prior to surgery.      Contact information:  [facility], Monday - Friday 8:30 am - 5:00 pm   [phone]  [facility], - Friday 8:30 am - 5:00 pm   [phone]

## 2016-10-28 NOTE — H&P (Addendum)
ANESTHESIOLOGY CONSULTATION AND PRE-OPERATIVE HISTORY AND PHYSICAL       Subjective:      [facility] Attending Physician: Donalynn Furlong, MD  [facility] NP / PA:   Amparo Bristol, CNP    Date of Surgery:  11/05/2016  Surgeon:  Dr. Vickii Penna  Diagnosis:  Bladder stone  Procedure:  TURP and cystolitholapaxy    Patient ID: Derrick Garner is a 66 y.o. male.    Patient is being seen today at the request of Dr. Vickii Penna to render an opinion on perioperative risk optimization and to coordinate medical care as necessary prior to the following procedure:  TURP and cystolitholapaxy.    Chief Complaint   Patient presents with    Pre-op Exam       History of Present Illness:  This is a 66 yo male with bladder stone. Patient presented with recurrent UTI and urinary retention. He has failed many voiding trials. Cystoscopy performed which demonstrated several bladder stones, a large median lobe, regrowth of the adenoma, concerning edema and changes in his bladder, and significant trabeculation.     Chronic Medical Conditions, Severity, Optimization:    See below    [facility] Activity Scale:  1 - Eating, getting dressed; working at a desk.       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     Social History Main Topics    Smoking status:  Former Smoker     Packs/day: 1.50     Years: 45.00     Types: Cigarettes, Cigars     Quit date: 12/28/2015    Smokeless tobacco: Never Used    Alcohol use No      Comment: quit 24yrs ago, hx of heavy alcohol abuse prior    Drug use: Unknown     Types: Marijuana      Comment: Hx of marijuana use not for last 36yrs    Sexual activity: Not on file     Other Topics Concern    Caffeine Use Yes    Occupational Exposure No    Exercise No    Seat Belt Yes     Social History Narrative    No narrative on file       Allergies:   No Known Allergies    Medications:     Prior to Admission medications taking for visit date 10/28/16   Medication Sig Taking? Authorizing Provider  albuterol (PROVENTIL) 2.5 mg /3 mL (0.083 %) nebulizer solution Inhale 2.5 mg by nebulization every 6 hours as needed.           Yes Historical Provider, MD   albuterol (PROVENTIL;VENTOLIN;PROAIR) 90 mcg/actuation inhaler Inhale 2 puffs into the lungs every 4 hours as needed for Wheezing. Yes Runell Gess Chikwa, CNP   ALPRAZolam Prudy Feeler) 1 MG tablet Take 1 mg by mouth 2 times a day.           Yes Historical Provider, MD   fluticasone-salmeterol (ADVAIR) 250-50 mcg/dose diskus inhaler Inhale 2 puffs into the lungs 2 times a day.           Yes Historical Provider, MD   tamsulosin (FLOMAX) 0.4 mg Cap TAKE ONE CAPSULE BY MOUTH ONCE DAILY AT BEDTIME Yes Courtney Plattner   tiotropium (SPIRIVA) 18 mcg Inhale 1 capsule (18 mcg total) into the lungs daily. Yes Ivery Quale, MD   ALPRAZolam Prudy Feeler) 1 MG tablet Take 2 mg by mouth after lunch. Between 1400-1500  Historical Provider, MD   diphenhydrAMINE (BENADRYL) 25 mg capsule Take 25-50 mg by mouth if needed.            Historical Provider, MD   food supplemt, lactose-reduced (ENSURE ORIGINAL) Liqd Take 1 Can by mouth 3 times a day. vanilla  Historical Provider, MD   ibuprofen (ADVIL) 200 MG tablet Take 400 mg by mouth if needed.            Historical Provider, MD   levoFLOXacin (LEVAQUIN) 500 MG tablet  Take 1 tablet (500 mg total) by mouth daily.  Edwina Barth, MD   levoFLOXacin (LEVAQUIN) 500 MG tablet Take 1 tablet (500 mg total) by mouth daily. Start taking ONE WEEK prior to your surgery  Gillis Ends   tamsulosin (FLOMAX) 0.4 mg Cp24 Take 0.4 mg by mouth daily.  Historical Provider, MD   predniSONE (DELTASONE) 20 MG tablet Take 20 mg by mouth daily.  Historical Provider, MD          Review of Systems   Constitutional: Positive for fatigue. Negative for chills and fever.   HENT: Negative for ear pain, hearing loss, mouth sores, sore throat and trouble swallowing.    Eyes: Negative for pain, redness and itching.   Respiratory: Positive for shortness of breath. Negative for cough, chest tightness and wheezing.    Cardiovascular: Negative for chest pain, palpitations and leg swelling.        (-) orthopnea   Gastrointestinal: Negative for abdominal pain, constipation, diarrhea, heartburn, nausea and vomiting.   Genitourinary: Negative for difficulty urinating, dysuria and hematuria.        Indwelling catheter   Musculoskeletal: Positive for gait problem. Negative for arthralgias, back pain, neck pain and neck stiffness.   Skin: Negative for pallor, rash and wound.   Neurological: Positive for weakness ( muscle). Negative for dizziness, seizures, light-headedness, numbness and headaches.   Hematological: Bruises/bleeds easily.   Psychiatric/Behavioral: Negative for confusion and sleep disturbance. The patient is not nervous/anxious.        Objective:   Blood pressure 107/68, pulse 96, temperature 97.6 F (36.4 C), temperature source Oral, height 5' 11 (1.803 m), weight 121 lb (54.9 kg), SpO2 98 %.    Physical Exam   Constitutional: He is oriented to person, place, and time. He appears well-developed and well-nourished. No distress.   Body mass index is 16.88 kg/m.     HENT:   Head: Normocephalic and atraumatic.   Right  Ear: Hearing and external ear normal.   Left Ear: Hearing and external ear normal.    Nose: Nose normal.   Mouth/Throat: Uvula is midline, oropharynx is clear and moist and mucous membranes are normal.   Teeth - intact, solid   Eyes: Conjunctivae, EOM and lids are normal. Pupils are equal, round, and reactive to light. No scleral icterus.   Neck: Normal range of motion. Neck supple. No JVD present. Carotid bruit is not present. No tracheal deviation present. No thyroid mass and no thyromegaly present.   Cardiovascular: Normal rate, regular rhythm, S1 normal, S2 normal and normal heart sounds.  Exam reveals no gallop and no friction rub.    No murmur heard.  Pulses:       Carotid pulses are 2+ on the right side, and 2+ on the left side.       Radial pulses are 2+ on the right side, and 2+ on the left side.        Dorsalis pedis pulses are 2+ on the right side, and 2+ on the left side.   Pulmonary/Chest: Effort normal. No stridor. No apnea. No respiratory distress. He has decreased breath sounds in the right upper field, the right middle field, the right lower field, the left upper field, the left middle field and the left lower field. He has no wheezes. He has no rhonchi. He has no rales.   Abdominal: Soft. Bowel sounds are normal. He exhibits no distension. There is no tenderness. There is no rebound and no guarding.   Musculoskeletal: Normal range of motion. He exhibits no edema or tenderness.   Neurological: He is alert and oriented to person, place, and time. He has normal strength. He displays no tremor. No cranial nerve deficit or sensory deficit. He exhibits normal muscle tone. Coordination normal.   Skin: Skin is warm and dry. No petechiae, no purpura and no rash noted. He is not diaphoretic. No cyanosis or erythema. Nails show no clubbing.   Psychiatric: He has a normal mood and affect. His behavior is normal. Judgment and thought content normal.       Airway:  Mallampati I (soft palate, uvula, fauces, and tonsillar pillars visible), Thyromental distance 3 finger breadths, opening 3 finger  breadths      Lab Review:     Lab Results   Component Value Date    WBC 15.5 (H) 09/16/2016    HGB 9.6 (L) 09/16/2016    HCT 28.1 (L) 09/16/2016    MCH 30.1 09/16/2016    PLT 298 09/16/2016    GLUCOSE 117 (H) 09/16/2016    CREATININE 1.17 09/16/2016    NA 135 09/16/2016    K 5.2 09/16/2016    CL 103 09/16/2016    CO2 28 09/16/2016    BILITOT 0.6 09/13/2016    PROT 7.3 09/13/2016    AST 13 09/13/2016    ALT 15 09/13/2016    ALKPHOS 61 09/13/2016         Study Results:   Study Results:    PFT's 03/08/2016  03/08/16 1241    FEV1 0.70    FEV1% 20    FVC 2.78    FVC% 58    FEV1/FVC 25    FEV1/FVC EXP 75    RESPONSE TO BRONCHODILATOR 6% FVC, 5% FEV1    TLC 9.44    TLC% 129    RV 6.61    RV% 258    VC 2.83    VC% 59  DLCO 7.29    DLCO% 23          ASA Physical Status:   ASA Physical Status:  3       Assessment and Recommendations:     This is a 66 yo male with Bladder stone for TURP and cystolitholapaxy.    Medical issues include:    1. Cardiac - no known CAD/CHF. His functional status is poor, [facility] activity of 1. + SOB without O2. Denies any CP with activities. He is scheduled to have an ECHO, advised to have this done prior to surgery.  Physical exam unremarkable.     2. 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 He uses albuterol, advair, spiriva. PFT's in 02/2016 with FEV1% 20, FEV/FVC 25, DLCO% 23. Lungs: decreased BS. Sats O2 @ 98% on 3L O2. May use inhalers in AM of OR.    3. BPH - symptoms controlled with  flomax, may continue perioperatively.    4. Anxiety - takes alprazolam prn. May take in AM of OR.    Pre-procedure instructions discussed and given to patient.  Labs today: CBC, BMP, T&S       M. Mallie Mussel, CNP    Attestation and Medical Decision Making    I have seen and examined this patient along with Amparo Bristol, CNP and resident Butch Penny, MD.  I have reviewed the above note as well as relevant laboratory data, imaging data and pertinent elements of the patients medical record.    Recommendations  for upcoming procedure include:  Type of Anesthetic: General  IV Access: Peripheral IVs  Monitors: Standard ASA monitors  Analgesia: Multimodal analgesia including IV opiates and other IV/PO analgesics  Postoperative Disposition: PACU    Special considerations for this patient and their upcoming procedure include:  Exam does not predict difficulty with PIV access or airway management. He has poor functional status and his pulmonologist had scheduled an echo which has not yet been performed - this is now scheduled for 11/02/16.     He has been using 2.5-3 L supplemental O2 continuously, and SpO2 is 98% today. Denies issues with orthopnea, and has had no LE swelling after having the foley placed.     The patient and family member present verbalized understanding of surgery and anesthetic plan and agree to proceed.    I have communicated my recommendations for the perioperative care of this patient with the surgeon through a shared EMR.    Macky Lower, MD    Addendum: 11/03/2016  ECHO 11/02/2016  Study Conclusions    - Left ventricle: The cavity size was normal. Wall thickness was  normal. Systolic function was probably normal. The study is not  technically sufficient to allow evaluation of LV diastolic  function.  - Right ventricle: Poorly visualized. Systolic function was normal  by objective interpretation. TAPSE: 2.3cm.  - Pericardium, extracardiac: A trivial pericardial effusion was  identified.    Impressions: Very technically difficult study.    Barbara Cower, CNP

## 2016-10-29 ENCOUNTER — Institutional Professional Consult (permissible substitution): Admit: 2016-10-29 | Payer: Medicare (Managed Care)

## 2016-10-29 DIAGNOSIS — N210 Calculus in bladder: Secondary | ICD-10-CM

## 2016-10-29 NOTE — Unmapped (Signed)
Pt came in today for a foley cath exchange. Current 18??Fr  removed.?? ??Under sterile conditions, patient was prepped with betadine and 18??Fr catheter was inserted.    ??Balloon inflated with 10 cc of sterile water. ??Catheter was flushed with 60??cc of sterile water with good return. Darol Destine, CNP??in the office at time of visit. ??Patient will call if any issues.   ??  ??

## 2016-11-02 ENCOUNTER — Inpatient Hospital Stay: Admit: 2016-11-02 | Payer: Medicare (Managed Care) | Attending: Acute Care

## 2016-11-02 DIAGNOSIS — J449 Chronic obstructive pulmonary disease, unspecified: Secondary | ICD-10-CM

## 2016-11-03 ENCOUNTER — Encounter: Payer: Medicare (Managed Care) | Attending: Pulmonary Disease

## 2016-11-04 NOTE — Unmapped (Signed)
Confirmed with Pt that he needs to arrive at 1000 for surgery tomorrow at 1200.

## 2016-11-05 ENCOUNTER — Observation Stay
Admit: 2016-11-05 | Discharge: 2016-11-07 | Disposition: A | Discharge status: Home / Self Care | Payer: Medicare (Managed Care) | Source: Ambulatory Visit

## 2016-11-05 DIAGNOSIS — N401 Enlarged prostate with lower urinary tract symptoms: Secondary | ICD-10-CM

## 2016-11-05 LAB — BASIC METABOLIC PANEL
Anion Gap: 4 mmol/L (ref 3–16)
BUN: 13 mg/dL (ref 7–25)
CO2: 35 mmol/L (ref 21–33)
Calcium: 8.6 mg/dL (ref 8.6–10.3)
Chloride: 102 mmol/L (ref 98–110)
Creatinine: 0.9 mg/dL (ref 0.60–1.30)
Glucose: 117 mg/dL (ref 70–100)
Osmolality, Calculated: 293 mOsm/kg (ref 278–305)
Potassium: 4.7 mmol/L (ref 3.5–5.3)
Sodium: 141 mmol/L (ref 133–146)
eGFR AA CKD-EPI: >90 See note.
eGFR NONAA CKD-EPI: 89 See note.

## 2016-11-05 LAB — CBC
Hematocrit: 35.1 % (ref 38.5–50.0)
Hemoglobin: 11.7 g/dL (ref 13.2–17.1)
MCH: 29.8 pg (ref 27.0–33.0)
MCHC: 33.2 g/dL (ref 32.0–36.0)
MCV: 89.7 fL (ref 80.0–100.0)
MPV: 7.1 fL (ref 7.5–11.5)
Platelets: 230 10*3/uL (ref 140–400)
RBC: 3.92 10*6/uL (ref 4.20–5.80)
RDW: 13.9 % (ref 11.0–15.0)
WBC: 9.9 10*3/uL (ref 3.8–10.8)

## 2016-11-05 MED ORDER — senna-docusate (SENNA-S) 8.6-50 mg per tablet 1 tablet
8.6-50 | Freq: Two times a day (BID) | ORAL | Status: AC
Start: 2016-11-05 — End: 2016-11-07
  Administered 2016-11-06 – 2016-11-07 (×4): 1 via ORAL

## 2016-11-05 MED ORDER — acetaminophen (TYLENOL) tablet 650 mg
325 | Freq: Four times a day (QID) | ORAL | Status: AC | PRN
Start: 2016-11-05 — End: 2016-11-07

## 2016-11-05 MED ORDER — midazolam (PF) (VERSED) 1 mg/mL injection
1 | INTRAMUSCULAR | Status: AC
Start: 2016-11-05 — End: ?

## 2016-11-05 MED ORDER — ondansetron (ZOFRAN) injection 4 mg
4 | Freq: Three times a day (TID) | INTRAMUSCULAR | Status: AC | PRN
Start: 2016-11-05 — End: 2016-11-07

## 2016-11-05 MED ORDER — umeclidinium (INCRUSE ELLIPTA) powder for inhalation DsDv 62.5 mcg
62.5 | Freq: Every day | RESPIRATORY_TRACT | Status: AC
Start: 2016-11-05 — End: 2016-11-07
  Administered 2016-11-07: 13:00:00 62.5 ug via RESPIRATORY_TRACT

## 2016-11-05 MED ORDER — proMETHazine (PHENERGAN) injection 6.25 mg
25 | Freq: Four times a day (QID) | INTRAMUSCULAR | Status: AC | PRN
Start: 2016-11-05 — End: 2016-11-05

## 2016-11-05 MED ORDER — oxyCODONE (ROXICODONE) immediate release tablet 10 mg
5 | Freq: Once | ORAL | Status: AC | PRN
Start: 2016-11-05 — End: 2016-11-05
  Administered 2016-11-05: 21:00:00 10 mg via ORAL

## 2016-11-05 MED ORDER — fentaNYL (SUBLIMAZE) injection 12.5 mcg
50 | INTRAMUSCULAR | Status: AC | PRN
Start: 2016-11-05 — End: 2016-11-05

## 2016-11-05 MED ORDER — lidocaine (PF) 2% (20 mg/mL) 20 mg/mL (2 %) Soln
20 | INTRAMUSCULAR | Status: AC
Start: 2016-11-05 — End: ?

## 2016-11-05 MED ORDER — ceFAZolin (ANCEF) IVPB 2 g in D5W (duplex)
2 | INTRAVENOUS | Status: AC | PRN
Start: 2016-11-05 — End: 2016-11-05

## 2016-11-05 MED ORDER — sodium chloride 0.9 % infusion
INTRAVENOUS | Status: AC
Start: 2016-11-05 — End: 2016-11-06
  Administered 2016-11-05 – 2016-11-06 (×2): 75 mL/h via INTRAVENOUS

## 2016-11-05 MED ORDER — naloxone (NARCAN) injection 0.04 mg
0.4 | INTRAMUSCULAR | Status: AC | PRN
Start: 2016-11-05 — End: 2016-11-05

## 2016-11-05 MED ORDER — lidocaine (PF) 20 mg/mL (2 %) Soln
20 | INTRAVENOUS | Status: AC | PRN
Start: 2016-11-05 — End: 2016-11-05
  Administered 2016-11-05: 15:00:00 60 via INTRAVENOUS

## 2016-11-05 MED ORDER — ondansetron (ZOFRAN) injection 8 mg
4 | Freq: Three times a day (TID) | INTRAMUSCULAR | Status: AC | PRN
Start: 2016-11-05 — End: 2016-11-07

## 2016-11-05 MED ORDER — dexamethasone (DECADRON) injection 4 mg
4 | Freq: Once | INTRAMUSCULAR | Status: AC | PRN
Start: 2016-11-05 — End: 2016-11-05

## 2016-11-05 MED ORDER — HYDROmorphone (DILAUDID) injection Syrg 0.1 mg
0.5 | INTRAMUSCULAR | Status: AC | PRN
Start: 2016-11-05 — End: 2016-11-05

## 2016-11-05 MED ORDER — HYDROmorphone (DILAUDID) injection Syrg 0.2 mg
0.5 | INTRAMUSCULAR | Status: AC | PRN
Start: 2016-11-05 — End: 2016-11-05

## 2016-11-05 MED ORDER — lactated Ringers infusion
INTRAVENOUS | Status: AC | PRN
Start: 2016-11-05 — End: 2016-11-05
  Administered 2016-11-05 (×2): via INTRAVENOUS

## 2016-11-05 MED ORDER — fentaNYL (SUBLIMAZE) injection 6.5 mcg
50 | INTRAMUSCULAR | Status: AC | PRN
Start: 2016-11-05 — End: 2016-11-05

## 2016-11-05 MED ORDER — ALPRAZolam (XANAX) tablet 1 mg
0.5 | Freq: Two times a day (BID) | ORAL | Status: AC | PRN
Start: 2016-11-05 — End: 2016-11-06
  Administered 2016-11-05 – 2016-11-06 (×2): 1 mg via ORAL

## 2016-11-05 MED ORDER — belladonna alkaloids-opium (B&O SUPPRETTES) 16.2-30 mg 1 suppository
16.2-30 | Freq: Four times a day (QID) | RECTAL | Status: AC | PRN
Start: 2016-11-05 — End: 2016-11-07

## 2016-11-05 MED ORDER — ondansetron (ZOFRAN) injection
4 | INTRAMUSCULAR | Status: AC | PRN
Start: 2016-11-05 — End: 2016-11-05
  Administered 2016-11-05: 17:00:00 4 via INTRAVENOUS

## 2016-11-05 MED ORDER — midazolam (PF) (VERSED) injection
1 | INTRAMUSCULAR | Status: AC | PRN
Start: 2016-11-05 — End: 2016-11-05
  Administered 2016-11-05: 15:00:00 2 via INTRAVENOUS

## 2016-11-05 MED ORDER — sterile water irrigation
Status: AC | PRN
Start: 2016-11-05 — End: 2016-11-05
  Administered 2016-11-05: 17:00:00 2500
  Administered 2016-11-05: 17:00:00 1000

## 2016-11-05 MED ORDER — meperidine (PF) (DEMEROL) Soln 12.5 mg
25 | INTRAMUSCULAR | Status: AC | PRN
Start: 2016-11-05 — End: 2016-11-05

## 2016-11-05 MED ORDER — fentaNYL (SUBLIMAZE) injection 25 mcg
50 | INTRAMUSCULAR | Status: AC | PRN
Start: 2016-11-05 — End: 2016-11-05
  Administered 2016-11-05 (×3): 25 ug via INTRAVENOUS

## 2016-11-05 MED ORDER — oxyCODONE (ROXICODONE) immediate release tablet 10 mg
5 | ORAL | Status: AC | PRN
Start: 2016-11-05 — End: 2016-11-07
  Administered 2016-11-06 – 2016-11-07 (×7): 10 mg via ORAL

## 2016-11-05 MED ORDER — diphenhydrAMINE (BENADRYL) capsule 25 mg
25 | Freq: Four times a day (QID) | ORAL | Status: AC | PRN
Start: 2016-11-05 — End: 2016-11-07

## 2016-11-05 MED ORDER — lactated Ringers infusion
INTRAVENOUS | Status: AC
Start: 2016-11-05 — End: 2016-11-05
  Administered 2016-11-05: 20:00:00 125 mL/h via INTRAVENOUS

## 2016-11-05 MED ORDER — sodium chloride, irrigation 0.9 % irrigation
0.9 | Status: AC | PRN
Start: 2016-11-05 — End: 2016-11-05
  Administered 2016-11-05 (×7): 3000
  Administered 2016-11-05: 16:00:00 500
  Administered 2016-11-05: 17:00:00 3000

## 2016-11-05 MED ORDER — ceFAZolin (ANCEF) IVPB 2 g in D5W (duplex)
2 | Freq: Three times a day (TID) | INTRAVENOUS | Status: AC
Start: 2016-11-05 — End: 2016-11-06
  Administered 2016-11-06 (×2): 2 g via INTRAVENOUS

## 2016-11-05 MED ORDER — albuterol (PROVENTIL) nebulizer solution 2.5 mg
2.5 | RESPIRATORY_TRACT | Status: AC | PRN
Start: 2016-11-05 — End: 2016-11-07

## 2016-11-05 MED ORDER — HYDROmorphone (DILAUDID) injection Syrg 0.4 mg
0.5 | INTRAMUSCULAR | Status: AC | PRN
Start: 2016-11-05 — End: 2016-11-05
  Administered 2016-11-05: 18:00:00 0.5 mg via INTRAVENOUS

## 2016-11-05 MED ORDER — ondansetron (ZOFRAN) injection 4 mg
4 | Freq: Three times a day (TID) | INTRAMUSCULAR | Status: AC | PRN
Start: 2016-11-05 — End: 2016-11-05

## 2016-11-05 MED ORDER — heparin (porcine) injection 5,000 Units
5000 | Freq: Three times a day (TID) | INTRAMUSCULAR | Status: AC
Start: 2016-11-05 — End: 2016-11-07
  Administered 2016-11-06 – 2016-11-07 (×4): 5000 [IU] via SUBCUTANEOUS

## 2016-11-05 MED ORDER — propofol10mgmlDIPRIVANinjection
10 | INTRAVENOUS | Status: AC | PRN
Start: 2016-11-05 — End: 2016-11-05
  Administered 2016-11-05: 15:00:00 150 via INTRAVENOUS

## 2016-11-05 MED ORDER — mometasone-formoterol (DULERA HFA) 100-5 mcg/actuation inhaler 2 puff
100-5 | Freq: Two times a day (BID) | RESPIRATORY_TRACT | Status: AC
Start: 2016-11-05 — End: 2016-11-07
  Administered 2016-11-06 – 2016-11-07 (×4): 2 via RESPIRATORY_TRACT

## 2016-11-05 MED ORDER — HYDROmorphone (DILAUDID) injection Syrg 0.6 mg
0.5 | INTRAMUSCULAR | Status: AC | PRN
Start: 2016-11-05 — End: 2016-11-05

## 2016-11-05 MED ORDER — oxyCODONE (ROXICODONE) immediate release tablet 5 mg
5 | Freq: Once | ORAL | Status: AC | PRN
Start: 2016-11-05 — End: 2016-11-05

## 2016-11-05 MED ORDER — oxyCODONE (ROXICODONE) immediate release tablet 5 mg
5 | ORAL | Status: AC | PRN
Start: 2016-11-05 — End: 2016-11-07

## 2016-11-05 MED ORDER — phenylephrine (NEO-SYNEPHRINE) injection
10 | INTRAMUSCULAR | Status: AC | PRN
Start: 2016-11-05 — End: 2016-11-05
  Administered 2016-11-05 (×9): 100 via INTRAVENOUS
  Administered 2016-11-05 (×2): 200 via INTRAVENOUS

## 2016-11-05 MED ORDER — fentaNYL (SUBLIMAZE) injection 50 mcg
50 | INTRAMUSCULAR | Status: AC | PRN
Start: 2016-11-05 — End: 2016-11-05

## 2016-11-05 MED ORDER — diphenhydrAMINE (BENADRYL) capsule 50 mg
25 | Freq: Four times a day (QID) | ORAL | Status: AC | PRN
Start: 2016-11-05 — End: 2016-11-07

## 2016-11-05 MED FILL — HEPARIN (PORCINE) 5,000 UNIT/ML INJECTION SOLUTION: 5000 5,000 unit/mL | INTRAMUSCULAR | Qty: 1

## 2016-11-05 MED FILL — CEFAZOLIN 2 GRAM/50 ML IN DEXTROSE (ISO-OSMOTIC) INTRAVENOUS PIGGYBACK: 2 2 gram/50 mL | INTRAVENOUS | Qty: 50

## 2016-11-05 MED FILL — OXYCODONE 5 MG TABLET: 5 5 MG | ORAL | Qty: 2

## 2016-11-05 MED FILL — DILAUDID (PF) 0.5 MG/0.5 ML INJECTION SYRINGE: 0.5 0.5 mg/0.5 mL | INTRAMUSCULAR | Qty: 0.5

## 2016-11-05 MED FILL — SENNOSIDES 8.6 MG-DOCUSATE SODIUM 50 MG TABLET: 8.6-50 8.6-50 mg | ORAL | Qty: 1

## 2016-11-05 MED FILL — MIDAZOLAM (PF) 1 MG/ML INJECTION SOLUTION: 1 1 mg/mL | INTRAMUSCULAR | Qty: 2

## 2016-11-05 MED FILL — ALPRAZOLAM 0.5 MG TABLET: 0.5 0.5 MG | ORAL | Qty: 2

## 2016-11-05 MED FILL — DULERA 100 MCG-5 MCG/ACTUATION HFA AEROSOL INHALER: 100-5 100-5 mcg/actuation | RESPIRATORY_TRACT | Qty: 8.8

## 2016-11-05 MED FILL — XYLOCAINE-MPF 20 MG/ML (2 %) INJECTION SOLUTION: 20 20 mg/mL (2 %) | INTRAMUSCULAR | Qty: 5

## 2016-11-05 MED FILL — FENTANYL (PF) 50 MCG/ML INJECTION SOLUTION: 50 50 mcg/mL | INTRAMUSCULAR | Qty: 2

## 2016-11-05 NOTE — Anesthesia Pre-Procedure Evaluation (Signed)
[place]  DEPARTMENT OF ANESTHESIOLOGY  PRE-PROCEDURAL EVALUATION    Derrick Garner is a 66 y.o. year old male presenting for:    Procedure(s):  transurethral resection of the prostate (TURP)with Cystolitholapaxy    Surgeon:   Gillis Ends    Chief Complaint     Bladder stone [N21.0]; Benign prostatic hyperplasia, unspecified whether l*    Review of Systems     Anesthesia Evaluation    CPC/PAT note reviewed.       I have reviewed the History and Physical Exam, any relevant changes are noted in the anesthesia pre-operative evaluation.      Cardiovascular:        Neuro/Muscoloskeletal/Psych:    (+) anxiety.      Pulmonary:    (+) shortness of breath.   COPD.    ROS comment: Severe copd with fev1 of .7 and fev1% of 20%   Doing well today      GI/Hepatic/Renal:      (-) GERD, renal disease.    Endo/Other:        (-) diabetes mellitus, hypothyroidism.     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     Social History Main Topics    Smoking status: Former Smoker     Packs/day: 1.50     Years: 45.00     Types: Cigarettes, Cigars     Quit date: 12/28/2015    Smokeless tobacco: Never Used    Alcohol use No      Comment: quit 61yrs ago, hx of heavy alcohol abuse prior     Drug use: Unknown     Types: Marijuana      Comment: Hx of marijuana use not for last 33yrs    Sexual activity: Not on file     Other Topics Concern    Caffeine Use Yes    Occupational Exposure No    Exercise No    Seat Belt Yes     Social History Narrative    No narrative on file       Medications     Allergies:  No Known Allergies    Home Meds:  Prior to Admission medications as of 11/05/16 1042   Medication Sig Taking?   albuterol (PROVENTIL) 2.5 mg /3 mL (0.083 %) nebulizer solution Inhale 2.5 mg by nebulization  every 6 hours as needed.           Yes   albuterol (PROVENTIL;VENTOLIN;PROAIR) 90 mcg/actuation inhaler Inhale 2 puffs into the lungs every 4 hours as needed for Wheezing. Yes   ALPRAZolam (XANAX) 1 MG tablet Take 1 mg by mouth 2 times a day.           Yes   ALPRAZolam (XANAX) 1 MG tablet Take 2 mg by mouth after lunch. Between 1400-1500 Yes   fluticasone-salmeterol (ADVAIR) 250-50 mcg/dose diskus inhaler Inhale 2 puffs into the lungs 2 times a day.           Yes   food supplemt, lactose-reduced (ENSURE ORIGINAL) Liqd Take 1 Can by mouth 3 times a day. vanilla Yes   ibuprofen (ADVIL) 200 MG tablet Take 400 mg by mouth if needed.           Yes   tamsulosin (FLOMAX) 0.4 mg Cap TAKE ONE CAPSULE BY MOUTH ONCE DAILY AT BEDTIME Yes   tamsulosin (FLOMAX) 0.4 mg Cp24 Take 0.4 mg by mouth daily. Yes   tiotropium (SPIRIVA) 18 mcg Inhale 1 capsule (18 mcg total) into the lungs daily. Yes   diphenhydrAMINE (BENADRYL) 25 mg capsule Take 25-50 mg by mouth if needed.              levoFLOXacin (LEVAQUIN) 500 MG tablet Take 1 tablet (500 mg total) by mouth daily.    levoFLOXacin (LEVAQUIN) 500 MG tablet Take 1 tablet (500 mg total) by mouth daily. Start taking ONE WEEK prior to your surgery        Inpatient Meds:  Scheduled:   Continuous:    lactated Ringers         PRN: ceFAZolin (ANCEF) IVPB, dexamethasone, fentaNYL **OR** fentaNYL **OR** fentaNYL **OR** fentaNYL **OR** fentaNYL, HYDROmorphone **OR**  HYDROmorphone **OR** HYDROmorphone **OR** HYDROmorphone **OR** HYDROmorphone, meperidine (PF), naloxone, ondansetron, oxyCODONE **OR** oxyCODONE, proMETHazine    Vital Signs     Wt Readings from Last 3 Encounters:   11/05/16 121 lb (54.9 kg)   10/28/16 121 lb (54.9 kg)   10/18/16 121 lb (54.9 kg)     Ht Readings from Last 3 Encounters:   11/05/16 5' 11 (1.803 m)   10/28/16 5' 11 (1.803 m)   10/18/16 5' 11 (1.803 m)     Temp Readings from Last 3 Encounters:   11/05/16 97.8 F (36.6 C) (Temporal)   10/28/16 97.6 F (36.4 C) (Oral)   09/17/16 97.9 F (36.6 C) (Oral)     BP Readings from Last 3 Encounters:   11/05/16 122/81   10/28/16 107/68   09/27/16 120/80     Pulse Readings from Last 3 Encounters:   11/05/16 96   10/28/16 96   10/18/16 102     @LASTSAO2 (3)@    Physical Exam     Airway:     Mallampati: II  Mouth Opening: >2 FB  TM distance: > = 3 FB  Neck ROM: full    Dental:   - No obvious cracked, loose, chipped, or missing teeth.     Pulmonary:      (+) decreased breath sounds.    Cardiovascular:     Rhythm: regular  Rate: normal    Neuro/Musculoskeletal/Psych:    Mental status: alert and oriented to person, place and time.          Abdominal:       Current OB Status:       Other Findings:  Laboratory Data     Lab Results   Component Value Date    WBC 10.8 10/28/2016    HGB 13.7 10/28/2016    HCT 41.9 10/28/2016    MCV 90.2 10/28/2016    PLT 285 10/28/2016       No results found for: [facility]    Lab Results   Component Value Date    GLUCOSE 96 10/28/2016    BUN 13 10/28/2016    CO2 35 (H) 10/28/2016    CREATININE 0.96 10/28/2016    K 5.0 10/28/2016    NA 139 10/28/2016    CL 97 (L) 10/28/2016    CALCIUM 10.0 10/28/2016    ALBUMIN 3.4 (L) 09/13/2016    PROT 7.3 09/13/2016    ALKPHOS 61 09/13/2016    ALT 15 09/13/2016    AST 13 09/13/2016    BILITOT 0.6 09/13/2016       No results found for: PTT, INR    No results found for: PREGTESTUR, PREGSERUM, HCG, HCGQUANT    Anesthesia Plan     ASA 3         Planned  PONV prophylaxis.    Anesthesia Type:  general LMA.         Intravenous induction.    Anesthetic plan and risks discussed with patient.    Plan, alternatives, and risks of anesthesia, including death, have been explained to and discussed with the patient/legal guardian.  By my assessment, the patient/legal guardian understands and agrees.  Scenario presented in detail.  Questions answered.    Blood products not discussed.  Plan discussed with CRNA and attending.

## 2016-11-05 NOTE — Unmapped (Signed)
Anesthesia Post Note    Patient: Derrick Garner    Procedure(s) Performed: Procedure(s):  transurethral resection of the prostate (TURP)with Cystolitholapaxy, SUPRAPUBIC CATHETER    Anesthesia type: general LMA    Patient location: PACU    Post pain: Adequate analgesia    Post assessment: no apparent anesthetic complications    Last Vitals:   Vitals:    11/05/16 1400 11/05/16 1415 11/05/16 1430 11/05/16 1500   BP: 96/52 (!) 81/35 (!) 75/48 96/52   Pulse: 86 82 85 99   Resp: 22 10 9 13    Temp:       TempSrc:       SpO2: 98% 98% 97% 90%   Weight:       Height:            Post vital signs: stable    Level of consciousness: awake, alert  and oriented    Complications: None

## 2016-11-05 NOTE — Unmapped (Signed)
OPERATIVE REPORT    Date of Operation: 11/05/16    Preoperative Diagnosis:   1. Urinary retention  2. Bladder stones    Postoperative Diagnosis:   1. Same    Procedure:    1. Cystoscopy  2. Suprapubic tube placement  3. Cystolitholapaxy  4. Transurethral resection of prostate  5. Bladder biopsy    Surgeon:   Gillis Ends    Assistants:   Lyn Henri, MD  Nicholes Mango, MD    Anesthesia: General endotracheal anesthesia    Indications: Derrick Garner is a 66 y.o. male who presents after failing multiple trials of void in the office.  He has been hospitalized with urinary tract infections and has been catheter dependant.  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 Informed consent was obtained and the risks, benefits, and details of the procedure were explained to the patient who elected to proceed.    Details of Procedure:  The patient was brought to the operating room and placed in the supine position on the operating room table. SCDs were placed on the lower extremities. Following induction of general anesthesia the patient was intubated without issue. Repositioned in dorsal lithotomy position. The patient was prepped and draped in the standard sterile fashion.    The 22 Fr cystoscope was advanced through the urethra and into the bladder.  The urethra was normal.  The prostate showed evidence of the prior TUR with a small median lobe but large regrowth of the bilateral lateral lobes.  There was some catheter associated cystitis and some suspicious papillary tissue of the right lateral wall which surrounded the right ureteral orifice.  The left ureteral orifice was visualized and was normal.  The bladder was moderately trabeculated.  The bladder stones were irrigated out through the sheath.  The bladder was distended and the dome was  visualized.  Transillumination could be seen on the abdominal wall.  The ultrasound probe was obtained and the suprapubic region was inspected and no bowel was visualized between the skin and the bladder.  The skin about 2 finger breadths above the pubic bone was anesthetized with 0.5% marcaine.  The local was also infused along the tract down to the bladder and the needle could eventually be seen entering the bladder dome.  A 1 cm incision was made in the skin.  The suprapubic tube introducer was advanced through the subcutaneous tissue and into the bladder under direct vision.  The obturator was removed and a 16 Fr Foley catheter was advanced through the sheath and into the bladder.  The balloon was inflated with 10 mL of SW and the catheter was pulled up against the bladder wall.  The cystoscope was removed.      Next, the 45 French resectoscope was assembled and advanced through the urethra and into the prostate. The ureteral orifices were visualized and were away from the area of resection.  The bipolar resectoscope loop was used to resect the prostate adenoma from the bladder neck to the verumontanum.  Minimal resection was done on the median lobe as it had not regrown from the prior TURP.  The lateral lobes and the anterior lobe were resected.  The resection of the anterior lobe was difficult due to calcifications that were adherent to the tissue.  Bleeding was controlled with coagulation current. Attention was paid to  stay proximal to the veru montanum at all times. The prostate was resected until a wide open channel was seen from the veru montanum to the bladder neck.  Adequate hemostasis was ensured. At the end of resection the ureteral orifices were intact.  The prostate tissue was removed from the bladder with the [facility] evacuator.      We turned our attention back to the suspicious appearing papillary tissue on the right lateral wall of the bladder.  The bipolar loop was used to resect a portion of this  tissue which was sent separately for pathology review.  Hemostasis was ensured with the coagulation current.  The right ureteral orifice was away from the area of resection.      The resectoscope was withdrawn while keeping the bladder distended. A 24-Fr 3-way foley catheter was placed with return of clear pink urine. The balloon was inflated to 60 ml. The catheter was placed to traction.    Dr. Gillis Ends, the attending surgeon, was present for the critical parts of the procedure and directed all clinical decision making.    Findings: Evidence of a prior TURP with defect of the median lobe but large regrowth of the lateral lobes, bladder stones evacuated, SP tube placed, lateral and anterior lobes resected, adherent calcifications on the anterior lobe making resection difficult, suspicious papillary tissue on the right lateral bladder wall biopsied, 24 Fr 3-way catheter placed, irrigation flowing in through the SP tube.    Estimated Blood Loss: 50 ml     Drains: 24-Fr three-way foley          Specimens: Prostate chips, bladder biopsy    Complications: None           Disposition: Patient will be admitted overnight for continuous bladder irrigation. Will hold CBI in the morning if urine color clear and attempt voiding trial thereafter.           Lyn Henri, MD

## 2016-11-05 NOTE — Unmapped (Signed)
H&P reviewed, patient examined, no changes to H&P. Again discussed that the procedure may not resolve his retention issues and that we were going to focus on clearing stones, trying to clear the median lobe, and will place a suprapubic tube in case he is unable to void. We did discuss his elevated PSA on prior office visits, with his severe medical comorbidities will not pursue a prostate biopsy.   Derrick Garner

## 2016-11-05 NOTE — Unmapped (Signed)
Anesthesia Transfer of Care Note    Patient: Derrick Garner  Procedure(s) Performed: Procedure(s):  transurethral resection of the prostate (TURP)with Cystolitholapaxy, SUPRAPUBIC CATHETER    Patient location: PACU    Anesthesia type: general LMA    Airway Device on Arrival to PACU/ICU: Nasal Cannula    IV Access: Peripheral    Monitors Recommended to be Used During PACU/ICU: Standard Monitors    Outstanding Issues to Address: None    Level of Consciousness: awake, alert  and oriented    Post vital signs:    Vitals:    11/05/16    BP: 111/59   Pulse: 91   Resp: 20   Temp: 97.2 ??F   SpO2: 99%       Complications: None      Date 11/04/16 0700 - 11/05/16 0659(Not Admitted) 11/05/16 0700 - 11/06/16 0659   Shift 0700-1459 1500-2259 2300-0659 24 Hour Total 0700-1459 1500-2259 2300-0659 24 Hour Total   I  N  T  A  K  E   I.V.     1200  (21.9)   1200  (21.9)      Volume (mL) (lactated Ringers infusion)     1200   1200    Shift Total  (mL/kg)     1200  (21.9)   1200  (21.9)   O  U  T  P  U  T   Shift Total  (mL/kg)           Weight (kg)     54.9 54.9 54.9 54.9

## 2016-11-05 NOTE — Other (Signed)
INTRA-OP POST BRIEFING NOTE: Derrick Garner      Specimens:   Specimens     ID Source Type Tests Collected By Collected At Frozen? Attributes Order ID Breast Spec Formalin Marked as Sent    A Prostate Tissue  SURGICAL PATHOLOGY EXAM   Gillis Ends 11/05/16 1254  Sent in Formalin  [id number]        Comment: Prostate chips           Prior to leaving the room: Nurse confirmed name of procedure, completion of instrument, sponge & needle counts, reads specimen labels aloud including patient name and addresses any equipment issues? Nurse confirmed wound class. Nurse to surgeon and anesthesia: What are key concerns for recovery and management of the patient?  Yes      Blood products stored at appropriate temperatures prior to return to blood bank (if applicable)? N/A      Patient identification band secured on patient prior to transfer out of the operating room? Yes      Other Comments:     Signed: SHARON THOMPSON    Date: 11/05/2016    Time: 1:20 PM

## 2016-11-05 NOTE — Unmapped (Signed)
Patient admitted to 5152 from PACU, report taken from [facility]. Pt A&Ox4. CBI running per order through 3 way catheter system, output light pink/clear. Pt wearing 3 L O2 nasal cannula, which is baseline. Pt tolerating PO, no n/v. Resting comfortably in bed at this time, call light within reach, will continue to monitor.

## 2016-11-05 NOTE — Unmapped (Signed)
Anesthesia Extubation Criteria:    Airway Device: laryngeal mask airway    Emergence Details:      Smooth      _x_      Stormy       __       Prolonged   __     Extubation Criteria:      Motor strength intact       _x_      Follows commands        _x_      Good airway reflexes      _x_      OP suctioned                  _x_        Follows commands:  Yes     Patient extubated:  Yes

## 2016-11-06 LAB — RENAL FUNCTION PANEL W/EGFR
Albumin: 2.8 g/dL (ref 3.5–5.7)
Anion Gap: 3 mmol/L (ref 3–16)
BUN: 11 mg/dL (ref 7–25)
CO2: 35 mmol/L (ref 21–33)
Calcium: 8.7 mg/dL (ref 8.6–10.3)
Chloride: 100 mmol/L (ref 98–110)
Creatinine: 0.92 mg/dL (ref 0.60–1.30)
Glucose: 98 mg/dL (ref 70–100)
Osmolality, Calculated: 285 mOsm/kg (ref 278–305)
Phosphorus: 2.3 mg/dL (ref 2.1–4.5)
Potassium: 4.3 mmol/L (ref 3.5–5.3)
Sodium: 138 mmol/L (ref 133–146)
eGFR AA CKD-EPI: >90 See note.
eGFR NONAA CKD-EPI: 86 See note.

## 2016-11-06 LAB — CBC
Hematocrit: 35.9 % (ref 38.5–50.0)
Hemoglobin: 11.6 g/dL (ref 13.2–17.1)
MCH: 29.1 pg (ref 27.0–33.0)
MCHC: 32.4 g/dL (ref 32.0–36.0)
MCV: 89.7 fL (ref 80.0–100.0)
MPV: 6.5 fL (ref 7.5–11.5)
Platelets: 199 10*3/uL (ref 140–400)
RBC: 4 10*6/uL (ref 4.20–5.80)
RDW: 14.4 % (ref 11.0–15.0)
WBC: 7.4 10*3/uL (ref 3.8–10.8)

## 2016-11-06 LAB — MAGNESIUM: Magnesium: 1.4 mg/dL (ref 1.5–2.5)

## 2016-11-06 MED ORDER — ALPRAZolam (XANAX) tablet 1 mg
0.5 | Freq: Four times a day (QID) | ORAL | Status: AC | PRN
Start: 2016-11-06 — End: 2016-11-07
  Administered 2016-11-06 – 2016-11-07 (×4): 1 mg via ORAL

## 2016-11-06 MED FILL — SENNOSIDES 8.6 MG-DOCUSATE SODIUM 50 MG TABLET: 8.6-50 8.6-50 mg | ORAL | Qty: 1

## 2016-11-06 MED FILL — HEPARIN (PORCINE) 5,000 UNIT/ML INJECTION SOLUTION: 5000 5,000 unit/mL | INTRAMUSCULAR | Qty: 1

## 2016-11-06 MED FILL — ALPRAZOLAM 0.5 MG TABLET: 0.5 0.5 MG | ORAL | Qty: 2

## 2016-11-06 MED FILL — OXYCODONE 5 MG TABLET: 5 5 MG | ORAL | Qty: 2

## 2016-11-06 MED FILL — INCRUSE ELLIPTA 62.5 MCG/ACTUATION POWDER FOR INHALATION: 62.5 62.5 mcg/actuation | RESPIRATORY_TRACT | Qty: 7

## 2016-11-06 NOTE — Unmapped (Signed)
Problem: Fall Prevention  Goal: Patient will remain free of falls  Assess and monitor vitals signs, neurological status including level of consciousness and orientation.  Reassess fall risk per hospital policy.    Ensure arm band on, uncluttered walking paths in room, adequate room lighting, call light and overbed table within reach, bed in low position, wheels locked, side rails up per policy (excluding SNF), and non-skid footwear provided.    Outcome: Progressing  Pt resting in bed with call light in reach, non-skid foot wear on and 3/4 side rails up.  Pt makes needs known and demonstrates proper use of call light, educated on fall prevention and demonstrates understanding

## 2016-11-06 NOTE — Unmapped (Signed)
Problem: Acute Pain  Patient's pain progressing toward patient's stated pain goal   Goal: Patient will manage pain with the appropriate technique/intervention  Assess and monitor patient's pain using appropriate pain scale. Collaborate with interdisciplinary team and initiate plan and interventions as ordered.  Re-assess patient's pain level 30-60 minutes after pain management intervention.   Outcome: Progressing

## 2016-11-06 NOTE — Unmapped (Signed)
Surgery Progress Note    11/06/2016               11:41 AM   Patient: Derrick Garner  LOS: 0 days     Admit Date: 11/05/2016  OR Date: 11/05/2016    Subjective:   Patient seen and examined this morning. No acute events o/n. CBI turned off early in am, still some clots and frank blood on recheck. Patient irrigated. CBI left off. On home O2 requirement.    Objective:   Vitals:  BP  Min: 75/48  Max: 116/62  Location could not be evaluated. This SmartLink does not work with rows of the type: String Type  Pulse  Avg: 93.1  Min: 73  Max: 115  Temp  Avg: 98.1 ??F (36.7 ??C)  Min: 97.2 ??F (36.2 ??C)  Max: 99.3 ??F (37.4 ??C)  SpO2  Avg: 90.7 %  Min: 21 %  Max: 100 %  FiO2  Avg: 86 %  Min: 74 %  Max: 100 %  Vitals:    11/06/16 0853   BP:    Pulse: 73   Resp: 16   Temp:    SpO2: 92%         Date 11/05/16 0700 - 11/06/16 0659 11/06/16 0700 - 11/07/16 0659   Shift 0700-1459 1500-2259 2300-0659 24 Hour Total 0700-1459 1500-2259 2300-0659 24 Hour Total   I  N  T  A  K  E   P.O.  600 100 700          P.O.  600 100 700        I.V.  (mL/kg) 1200  (21.9)  806  (14.7) 2006  (36.5) 274  (5)   274  (5)      I.V.   806 806          Volume (mL) (lactated Ringers infusion) 1200   1200          Volume (mL) (sodium chloride 0.9 % infusion)     274   274    Shift Total  (mL/kg) 1200  (21.9) 600  (10.9) 906  (16.5) 2706  (49.3) 274  (5)   274  (5)   O  U  T  P  U  T   Urine  (mL/kg/hr) 300  (0.7) -200  (-0.5) 850  (1.9) 950  (0.7)          Urine 1100   1100          CBI Net Output (mL) -1900 -200 850 -1250          Output (mL) (IUC (Foley) Triple-lumen (3-Way) 14 Fr.) 1100   1100        Shift Total  (mL/kg) 300  (5.5) -200  (-3.6) 850  (15.5) 950  (17.3)       Weight (kg) 54.9 54.9 54.9 54.9 54.9 54.9 54.9 54.9       Physical Exam:  Gen: Alert and oriented x3, no acute distress  HEENT: NCAT, PERRL, neck supple  CV: Regular rate and rhythm, normal S1 and S2  Resp: Diffuse wheezing, no respiratory distress on 3L NC  Abd: Soft, non-distended, non-tender,  no masses  Ext: Warm and well perfused  Wound: Incision clean/dry/intact    Labs:  Recent Labs      11/05/16   1617  11/06/16   0430   WBC  9.9  7.4   HGB  11.7*  11.6*   HCT  35.1*  35.9*  PLT  230  199     Recent Labs      11/05/16   1617  11/06/16   0430   NA  141  138   K  4.7  4.3   CL  102  100   CO2  35*  35*   BUN  13  11   CREATININE  0.90  0.92   GLUCOSE  117*  98   CALCIUM  8.6  8.7   MG   --   1.4*   PHOS   --   2.3     No results for input(s): POCGLU, POCGMD in the last 72 hours.    Invalid input(s): GLU  Recent Labs      11/06/16   0430   ALBUMIN  2.8*     No results for input(s): INR, PROTIME in the last 72 hours.    Current Medications:  Scheduled Medications:    heparin (porcine) 5,000 Units 3 times per day   mometasone-formoterol 2 puff RT Q12H   senna-docusate 1 tablet BID   umeclidinium 62.5 mcg RT Daily      IV Medications:      PRN Medications:    acetaminophen 650 mg Q6H PRN   albuterol 2.5 mg RT Q4H PRN   ALPRAZolam 1 mg BID PRN   belladonna alkaloids-opium 1 suppository Q6H PRN   diphenhydrAMINE 25 mg Q6H PRN   Or     diphenhydrAMINE 50 mg Q6H PRN   ondansetron 4 mg Q8H PRN   Or     ondansetron 8 mg Q8H PRN   oxyCODONE 5 mg Q4H PRN   Or     oxyCODONE 10 mg Q4H PRN       Assessment/Plan:   Derrick Garner is a 66 y.o. male who presents after failing multiple trials of void in the office.  He has been hospitalized with urinary tract infections and has been catheter dependant.  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 Now s/p cystoscopy, SPT placement, cystolitholapaxy, TURP, bladder biopsy.    NEURO: PRN: Tylenol, oxycodone, B&O supp  CV: HDS  PULM: On home O2. Home inhalers ordered, IS  FEN/GI: Diet: Regular, HLIVF  GU: Good UOP, Cr baseline. CBI clamped  ID: No issues  HEME: Stable  ACCESS: PIV  ENDO: No issues  PPX: SCD,  SQH  CODE: Full  DISPO: Floor, possible d/c tomorrow if urine clears.    Nicholes Mango, MD  Urology  11/06/2016

## 2016-11-06 NOTE — Care Coordination-Inpatient (Signed)
MOON notice explained to patient, understanding verbalized, refused to sign copy for chart.

## 2016-11-07 LAB — CBC
Hematocrit: 32.3 % — ABNORMAL LOW (ref 38.5–50.0)
Hemoglobin: 10.9 g/dL — ABNORMAL LOW (ref 13.2–17.1)
MCH: 29.8 pg (ref 27.0–33.0)
MCHC: 33.6 g/dL (ref 32.0–36.0)
MCV: 88.7 fL (ref 80.0–100.0)
MPV: 6.6 fL — ABNORMAL LOW (ref 7.5–11.5)
Platelets: 178 10E3/uL (ref 140–400)
RBC: 3.64 10E6/uL — ABNORMAL LOW (ref 4.20–5.80)
RDW: 14.1 % (ref 11.0–15.0)
WBC: 6.7 10E3/uL (ref 3.8–10.8)

## 2016-11-07 LAB — BASIC METABOLIC PANEL
Anion Gap: 3 mmol/L (ref 3–16)
BUN: 12 mg/dL (ref 7–25)
CO2: 35 mmol/L — ABNORMAL HIGH (ref 21–33)
Calcium: 8.3 mg/dL — ABNORMAL LOW (ref 8.6–10.3)
Chloride: 97 mmol/L — ABNORMAL LOW (ref 98–110)
Creatinine: 0.99 mg/dL (ref 0.60–1.30)
Glucose: 90 mg/dL (ref 70–100)
Osmolality, Calculated: 279 mosm/kg (ref 278–305)
Potassium: 4.5 mmol/L (ref 3.5–5.3)
Sodium: 135 mmol/L (ref 133–146)
eGFR AA CKD-EPI: >90 See note.
eGFR NONAA CKD-EPI: 79 See note.

## 2016-11-07 LAB — PHOSPHORUS: Phosphorus: 2.6 mg/dL (ref 2.1–4.5)

## 2016-11-07 LAB — MAGNESIUM: Magnesium: 1.3 mg/dL — ABNORMAL LOW (ref 1.5–2.5)

## 2016-11-07 MED ORDER — senna-docusate (SENNA-S) 8.6-50 mg per tablet
8.6-50 | ORAL_TABLET | Freq: Two times a day (BID) | ORAL | 0 refills | Status: AC
Start: 2016-11-07 — End: ?

## 2016-11-07 MED ORDER — oxyCODONE (ROXICODONE) 5 MG immediate release tablet
5 | ORAL_TABLET | Freq: Four times a day (QID) | ORAL | 0 refills | 6.00000 days | Status: AC | PRN
Start: 2016-11-07 — End: 2016-11-14

## 2016-11-07 MED ORDER — magnesium hydroxide (MILK OF MAGNESIA) 2,400 mg/10 mL oral suspension 10 mL
2400 | Freq: Every day | ORAL | Status: AC | PRN
Start: 2016-11-07 — End: 2016-11-07
  Administered 2016-11-07: 19:00:00 10 mL via ORAL

## 2016-11-07 MED FILL — SENNOSIDES 8.6 MG-DOCUSATE SODIUM 50 MG TABLET: 8.6-50 8.6-50 mg | ORAL | Qty: 1

## 2016-11-07 MED FILL — OXYCODONE 5 MG TABLET: 5 5 MG | ORAL | Qty: 2

## 2016-11-07 MED FILL — MAGNESIUM HYDROXIDE 2,400 MG/10 ML ORAL SUSPENSION: 2400 2,400 mg/10 mL | ORAL | Qty: 10

## 2016-11-07 MED FILL — HEPARIN (PORCINE) 5,000 UNIT/ML INJECTION SOLUTION: 5000 5,000 unit/mL | INTRAMUSCULAR | Qty: 1

## 2016-11-07 MED FILL — ALPRAZOLAM 0.5 MG TABLET: 0.5 0.5 MG | ORAL | Qty: 2

## 2016-11-07 NOTE — Discharge Summary (Signed)
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 He has been hospitalized with urinary tract infections and has been catheter dependant. 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    Operations/Procedures Performed:   1. Cystoscopy  2. Suprapubic tube placement  3. Cystolitholapaxy  4. Transurethral resection of prostate  5. Bladder biopsy    Hospital Course: Patient admitted on 11/05/2016 and underwent abovementioned procedure(s) on 11/05/2016. Tolerated the procedure well with no complications. Please see full operative report for further details regarding the operation. Postoperatilvely transferred to PACU then the floor in stable condition. Pain controlled post-op with PO medications and patient was given a regular diet. Patient started on CBI in the OR. On the morning of POD #1, patient's CBI was stopped, but he still had significant hematuria after rechecking the urine later in the day. CBI was restarted through the remainder of the day. On POD #2 CBI was again stopped and urine remained clear for over 4 hours and foley catheter  was removed and SPT clamped. Patient was unable to void normally so SPT was placed to gravity and patient received foley bag teaching.  At time of discharge, the patient was tolerating oral food and hydration, voiding spontaneously, had return of bowel function, was ambulating without difficulty, and pain was controlled on oral medications. The patient was determined to be suitable for discharge and the patient felt comfortable with that decision.     Consults: None    Significant Diagnostic Studies: None    Discharge Exam:  Blood pressure 137/72, pulse 98, temperature 98.9 F (37.2 C), temperature source Oral, resp. rate 16, height 5' 11 (1.803 m), weight 121 lb (54.9 kg), SpO2 94 %.    Gen - NAD, AOx3  CV - RRR  Resp - CTAB  Abd - soft, NT/ND  Ext - warm, well-perfused  GU - SPT in place draining clear Urine    Disposition: Discharged home in good condition    Discharge Medications:  Discharge Medication List as of 11/07/2016  1:56 PM      START taking these medications    Details   oxyCODONE (ROXICODONE) 5 MG immediate release tablet Take 1 tablet (5 mg total) by mouth every 6 hours as needed for up to 7 days., Starting Sun 11/07/2016, Until Sun 11/14/2016, Print      senna-docusate (SENNA-S) 8.6-50 mg per tablet Take 1 tablet by mouth 2 times a day., Starting Sun 11/07/2016, Print  CONTINUE these medications which have NOT CHANGED    Details   albuterol (PROVENTIL) 2.5 mg /3 mL (0.083 %) nebulizer solution Inhale 2.5 mg by nebulization every 6 hours as needed.          , Starting Tue 02/10/2016, Historical Med      albuterol (PROVENTIL;VENTOLIN;PROAIR) 90 mcg/actuation inhaler Inhale 2 puffs into the lungs every 4 hours as needed for Wheezing., Starting Wed 04/21/2016, Normal      !! ALPRAZolam (XANAX) 1 MG tablet Take 1 mg by mouth 2 times a day.          , Starting Tue 01/20/2016, Historical Med      !! ALPRAZolam (XANAX) 1 MG tablet Take 2 mg by mouth after lunch. Between 1400-1500, Historical Med       fluticasone-salmeterol (ADVAIR) 250-50 mcg/dose diskus inhaler Inhale 2 puffs into the lungs 2 times a day.          , Historical Med      food supplemt, lactose-reduced (ENSURE ORIGINAL) Liqd Take 1 Can by mouth 3 times a day. vanilla, Historical Med      ibuprofen (ADVIL) 200 MG tablet Take 400 mg by mouth if needed.          , Historical Med      tamsulosin (FLOMAX) 0.4 mg Cap TAKE ONE CAPSULE BY MOUTH ONCE DAILY AT BEDTIME, Normal      tiotropium (SPIRIVA) 18 mcg Inhale 1 capsule (18 mcg total) into the lungs daily., Starting Wed 05/26/2016, Normal      diphenhydrAMINE (BENADRYL) 25 mg capsule Take 25-50 mg by mouth if needed.          , Historical Med       !! - Potential duplicate medications found. Please discuss with provider.      STOP taking these medications       levoFLOXacin (LEVAQUIN) 500 MG tablet Comments:   Reason for Stopping:         levoFLOXacin (LEVAQUIN) 500 MG tablet Comments:   Reason for Stopping:               Patient Instructions:  Activity:    Activity as tolerated, no limitations, walk or exercise daily    Return to work:   May return to work as tolerated    Diet:   Resume home diet, drink plenty of water    Other Instructions:   Take medications as prescribed. No driving while taking narcotic pain medications.    Some blood in urine can be expected after procedure    Go to the ER or call the Urology Resident on call at [phone] if you experience any of the following:  - Worsening pain, nausea, or vomiting not relieved by medications  - Temperature greater than 101.5 F or fever/chills  - Chest pain, shortness of breath, persistent dizziness, swelling in one or both legs  - Inability to urinate or if SPT not draining    Foley Catheter Care:   The tube draining your urine is called a Foley Catheter. The catheter is held in place by a balloon inside your bladder. Urine will drain continuously from the bladder into a bag. At night, you will wear a larger drainage bag to collect the  urine. Keep this bag in a container, such as a plastic waste basket, in case accidental leakage. During the day, you may wear a smaller leg bag that fits on your outer thigh. This smaller bag is discrete and can be  worn under loose fitting clothes. The smaller bag will fill quickly and should be emptied every few hours.    Note: It is not unusual for urine to leak around the Foley catheter. If leakage is severe, or if leakage is accompanied by spasms in the bladder (a sharp pain/pressure in the pubic area followed by urinary leakage), a bladder antispasmodic may be called into your pharmacy. Please contact the office during normal business hours if you would like this anti-spasm medication called in. Please be aware that such antispasmodics can be constipating, so a stool softner is recommended as well.    To empty the leg bag, first wash your hands. Remove the cap on the bottom of the leg bag, and then turn the valve to empty. When completed, close the valve, replace the cap and wash your hands. Follow the same directions to drain the larger bag. To change from one bag to another, first wash your hands. Empty the bag; carefully separate the catheter tubing from the tubing on the bag (note the two different colors that can help you distinguish where the tubing separates). Next, connect the catheter to the new drainage tubing. Try not to touch the tip of the catheter or the tip of the drainage tubing If possible. When finished, wash your hands again. Taping the catheter abdomen will lessen the discomfort from the catheter. Use adhesive or silk-like tape (not paper tape) that is 3 inches wide. Switching to a different thigh each day will prevent skin blistering. To clean the urinary collection bag, use warm soapy water, rinse clear, then clean with a solution of one tablespoon of vinegar to one quart of water and again rinse clear. Be sure to leave the drainage spout open while hanging the bag to  dry.      Follow-Up:  Future Appointments  Date Time Provider Department Center   11/22/2016 8:45 AM Gillis Ends [facility] URO [facility]       Wh[facility]aker Lenis [facility]  11/08/2016

## 2016-11-07 NOTE — Unmapped (Signed)
Pt okay to discharge, discharge instructions explained, IV removed, pt leaving with belongings ,gave pt prescriptions, pt verbalized understanding of discharge information,  will continue to monitor until patients leaves via wheel chair.

## 2016-11-07 NOTE — Unmapped (Signed)
Surgery Progress Note    11/07/2016               11:24 AM   Patient: Derrick Garner  LOS: 0 days     Admit Date: 11/05/2016  OR Date: 11/05/2016    Subjective:   Patient seen and examined this morning. No acute events o/n. Urine clear off CBI all night. Tolerating diet. Pain well controlled. Passing gas. Afebrile, HDS.    Objective:   Vitals:  BP  Min: 87/54  Max: 110/64  Location could not be evaluated. This SmartLink does not work with rows of the type: String Type  Pulse  Avg: 101.6  Min: 93  Max: 113  Temp  Avg: 98.7 ??F (37.1 ??C)  Min: 97.5 ??F (36.4 ??C)  Max: 99.9 ??F (37.7 ??C)  SpO2  Avg: 95.6 %  Min: 93 %  Max: 97 %  Vitals:    11/07/16 0836   BP:    Pulse: 100   Resp: 16   Temp:    SpO2: 93%         Date 11/06/16 0700 - 11/07/16 0659 11/07/16 0700 - 11/08/16 0659   Shift 0700-1459 1500-2259 2300-0659 24 Hour Total 0700-1459 1500-2259 2300-0659 24 Hour Total   I  N  T  A  K  E   P.O. 240 240  480          P.O. 240 240  480        I.V.  (mL/kg) 274  (5)   274  (5)          Volume (mL) (sodium chloride 0.9 % infusion) 274   274        Shift Total  (mL/kg) 514  (9.4) 240  (4.4)  754  (13.7)       O  U  T  P  U  T   Urine  (mL/kg/hr) 1050  (2.4)  875  (2) 1925  (1.5)          Output (mL) (IUC (Foley) Triple-lumen (3-Way) 14 Fr.) 1050  875 1925        Shift Total  (mL/kg) 1050  (19.1)  875  (15.9) 1925  (35.1)       Weight (kg) 54.9 54.9 54.9 54.9 54.9 54.9 54.9 54.9       Physical Exam:  Gen: Alert and oriented x3, no acute distress  HEENT: NCAT, PERRL, neck supple  CV: Regular rate and rhythm, normal S1 and S2  Resp: Diffuse wheezing, no respiratory distress on 3L NC  Abd: Soft, non-distended, non-tender, no masses, SPT in place. Skin around tube healthy  Ext: Warm and well perfused  GU: Urine clear with no clots.    Labs:  Recent Labs      11/05/16   1617  11/06/16   0430  11/07/16   0349   WBC  9.9  7.4  6.7   HGB  11.7*  11.6*  10.9*   HCT  35.1*  35.9*  32.3*   PLT  230  199  178     Recent Labs      11/05/16    1617  11/06/16   0430  11/07/16   0349   NA  141  138  135   K  4.7  4.3  4.5   CL  102  100  97*   CO2  35*  35*  35*   BUN  13  11  12  CREATININE  0.90  0.92  0.99   GLUCOSE  117*  98  90   CALCIUM  8.6  8.7  8.3*   MG   --   1.4*  1.3*   PHOS   --   2.3  2.6     No results for input(s): POCGLU, POCGMD in the last 72 hours.    Invalid input(s): GLU  Recent Labs      11/06/16   0430   ALBUMIN  2.8*     No results for input(s): INR, PROTIME in the last 72 hours.    Current Medications:  Scheduled Medications:    heparin (porcine) 5,000 Units 3 times per day   mometasone-formoterol 2 puff RT Q12H   senna-docusate 1 tablet BID   umeclidinium 62.5 mcg RT Daily      IV Medications:      PRN Medications:    acetaminophen 650 mg Q6H PRN   albuterol 2.5 mg RT Q4H PRN   ALPRAZolam 1 mg 4x Daily PRN   belladonna alkaloids-opium 1 suppository Q6H PRN   diphenhydrAMINE 25 mg Q6H PRN   Or     diphenhydrAMINE 50 mg Q6H PRN   ondansetron 4 mg Q8H PRN   Or     ondansetron 8 mg Q8H PRN   oxyCODONE 5 mg Q4H PRN   Or     oxyCODONE 10 mg Q4H PRN       Assessment/Plan:   Derrick Garner is a 66 y.o. male who presents after failing multiple trials of void in the office.  He has been hospitalized with urinary tract infections and has been catheter dependant.  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 Now s/p cystoscopy, SPT placement, cystolitholapaxy, TURP, bladder biopsy.    NEURO: PRN: Tylenol, oxycodone, B&O supp  CV: HDS  PULM: On home O2. Home inhalers ordered, IS  FEN/GI: Diet: Regular, HLIVF  GU: Good UOP, Cr baseline. Urethral foley removed. SPT clamped for void trial.  ID: No issues  HEME: Stable  ACCESS: PIV  ENDO: No issues  PPX: SCD, SQH  CODE: Full  DISPO: Discharge today    Nicholes Mango, MD  Urology  11/07/2016

## 2016-11-07 NOTE — Unmapped (Signed)
Activity:    Activity as tolerated, no limitations, walk or exercise daily    Return to work:   May return to work as tolerated    Diet:   Resume home diet, drink plenty of water    Other Instructions:   Take medications as prescribed. No driving while taking narcotic pain medications.    Some blood in urine can be expected after procedure    Go to the ER or call the Urology Resident on call at [phone] if you experience any of the following:  - Worsening pain, nausea, or vomiting not relieved by medications  - Temperature greater than 101.5 F or fever/chills  - Chest pain, shortness of breath, persistent dizziness, swelling in one or both legs  - Inability to urinate or if SPT not draining    Foley Catheter Care:   The tube draining your urine is called a Foley Catheter. The catheter is held in place by a balloon inside your bladder. Urine will drain continuously from the bladder into a bag. At night, you will wear a larger drainage bag to collect the urine. Keep this bag in a container, such as a plastic waste basket, in case accidental leakage. During the day, you may wear a smaller leg bag that fits on your outer thigh. This smaller bag is discrete and can be worn under loose fitting clothes. The smaller bag will fill quickly and should be emptied every few hours.    Note: It is not unusual for urine to leak around the Foley catheter. If leakage is severe, or if leakage is accompanied by spasms in the bladder (a sharp pain/pressure in the pubic area followed by urinary leakage), a bladder antispasmodic may be called into your pharmacy. Please contact the office during normal business hours if you would like this anti-spasm medication called in. Please be aware that such antispasmodics can be constipating, so a stool softner is recommended as well.    To empty the leg bag, first wash your hands. Remove the cap on the bottom of the leg bag, and then turn the valve to empty. When completed, close the valve,  replace the cap and wash your hands. Follow the same directions to drain the larger bag. To change from one bag to another, first wash your hands. Empty the bag; carefully separate the catheter tubing from the tubing on the bag (note the two different colors that can help you distinguish where the tubing separates). Next, connect the catheter to the new drainage tubing. Try not to touch the tip of the catheter or the tip of the drainage tubing If possible. When finished, wash your hands again. Taping the catheter abdomen will lessen the discomfort from the catheter. Use adhesive or silk-like tape (not paper tape) that is 3 inches wide. Switching to a different thigh each day will prevent skin blistering. To clean the urinary collection bag, use warm soapy water, rinse clear, then clean with a solution of one tablespoon of vinegar to one quart of water and again rinse clear. Be sure to leave the drainage spout open while hanging the bag to dry.

## 2016-11-08 NOTE — Unmapped (Signed)
Catheter draining, but having problem with large bag - to call me tomorrow, may come in/

## 2016-11-08 NOTE — Unmapped (Signed)
Pt calling as he was to change leg bag to night bag but the ends don't fit. Pt is asking if there is different tubing that he needs to have. He was taught how to change bags, but it does not hook up the way it should. Please advise.

## 2016-11-09 NOTE — Unmapped (Signed)
Called and spoke with patient, stated that he is having trouble with his leg bag and he needs to have it looked at. Patient stated that he is emptying fine its just when he changes his leg bag to the over night bag. Patient stated he can come in around 4pm for [place] to look at it.

## 2016-11-09 NOTE — Unmapped (Signed)
Pt calling as he need to come into office sometime today after 1PM. Pt having a problem with large bag connection.

## 2016-11-12 NOTE — Unmapped (Signed)
Received form from [employer] for pts O2  Physician completed form  Returned fax confirmation received

## 2016-11-14 ENCOUNTER — Inpatient Hospital Stay: Admit: 2016-11-14 | Payer: Medicare (Managed Care)

## 2016-11-14 ENCOUNTER — Inpatient Hospital Stay
Admit: 2016-11-14 | Discharge: 2016-11-14 | Disposition: A | Discharge status: Other Acute Inpatient Hospital | Payer: Medicare (Managed Care)

## 2016-11-14 ENCOUNTER — Inpatient Hospital Stay: Admit: 2016-11-15 | Payer: Medicare (Managed Care)

## 2016-11-14 ENCOUNTER — Emergency Department: Admit: 2016-11-14 | Payer: Medicare (Managed Care)

## 2016-11-14 ENCOUNTER — Inpatient Hospital Stay
Admit: 2016-11-14 | Discharge: 2016-11-27 | Disposition: E | Payer: Medicare (Managed Care) | Source: Other Acute Inpatient Hospital | Attending: Pulmonary Disease | Admitting: Pulmonary Disease

## 2016-11-14 DIAGNOSIS — I469 Cardiac arrest, cause unspecified: Secondary | ICD-10-CM

## 2016-11-14 DIAGNOSIS — J939 Pneumothorax, unspecified: Secondary | ICD-10-CM

## 2016-11-14 LAB — CBC
Hematocrit: 41.3 % (ref 38.5–50.0)
Hematocrit: 35.3 % — ABNORMAL LOW (ref 38.5–50.0)
Hemoglobin: 13.6 g/dL (ref 13.2–17.1)
Hemoglobin: 11.7 g/dL — ABNORMAL LOW (ref 13.2–17.1)
MCH: 29.3 pg (ref 27.0–33.0)
MCH: 29.9 pg (ref 27.0–33.0)
MCHC: 32.8 g/dL (ref 32.0–36.0)
MCHC: 33.1 g/dL (ref 32.0–36.0)
MCV: 89.2 fL (ref 80.0–100.0)
MCV: 90.4 fL (ref 80.0–100.0)
MPV: 7.3 fL (ref 7.5–11.5)
MPV: 6.9 fL — ABNORMAL LOW (ref 7.5–11.5)
Platelets: 629 10*3/uL (ref 140–400)
Platelets: 343 10E3/uL (ref 140–400)
RBC: 4.63 10*6/uL (ref 4.20–5.80)
RBC: 3.91 10E6/uL — ABNORMAL LOW (ref 4.20–5.80)
RDW: 13.8 % (ref 11.0–15.0)
RDW: 14.3 % (ref 11.0–15.0)
WBC: 21.1 10*3/uL (ref 3.8–10.8)
WBC: 18.4 10E3/uL — ABNORMAL HIGH (ref 3.8–10.8)

## 2016-11-14 LAB — RENAL FUNCTION PANEL W/EGFR
Albumin: 2.8 g/dL — ABNORMAL LOW (ref 3.5–5.7)
Anion Gap: 15 mmol/L (ref 3–16)
BUN: 13 mg/dL (ref 7–25)
CO2: 28 mmol/L (ref 21–33)
Calcium: 9.5 mg/dL (ref 8.6–10.3)
Chloride: 101 mmol/L (ref 98–110)
Creatinine: 1.02 mg/dL (ref 0.60–1.30)
Glucose: 266 mg/dL — ABNORMAL HIGH (ref 70–100)
Osmolality, Calculated: 307 mosm/kg — ABNORMAL HIGH (ref 278–305)
Phosphorus: 3.3 mg/dL (ref 2.1–4.5)
Potassium: 3.6 mmol/L (ref 3.5–5.3)
Sodium: 144 mmol/L (ref 133–146)
eGFR AA CKD-EPI: 88 See note.
eGFR NONAA CKD-EPI: 76 See note.

## 2016-11-14 LAB — ABO/RH
Rh Type: POSITIVE
Rh Type: POSITIVE

## 2016-11-14 LAB — ETHANOL, SERUM: Ethanol: <10 mg/dL (ref 0–10)

## 2016-11-14 LAB — CREATININE, SERUM
Creatinine: 0.87 mg/dL (ref 0.60–1.30)
eGFR AA CKD-EPI: >90 See note.
eGFR NONAA CKD-EPI: 90 See note.

## 2016-11-14 LAB — HEPATIC FUNCTION PANEL
ALT: 58 U/L (ref 7–52)
AST: 80 U/L (ref 13–39)
Albumin: 2.8 g/dL (ref 3.5–5.7)
Alkaline Phosphatase: 51 U/L (ref 36–125)
Bilirubin, Direct: 0.23 mg/dL (ref 0.00–0.40)
Bilirubin, Indirect: 0.67 mg/dL (ref 0.00–1.10)
Total Bilirubin: 0.9 mg/dL (ref 0.0–1.5)
Total Protein: 5.7 g/dL (ref 6.4–8.9)

## 2016-11-14 LAB — VENOUS BLOOD GAS, LINE/SYRINGE
%HBO2-Line Draw: 89.2 % (ref 40.0–70.0)
%HBO2-Line Draw: 41.4 % (ref 40.0–70.0)
Base Excess-Line Draw: -1.6 mmol/L (ref <=3.0)
Base Excess-Line Draw: -2.2 mmol/L (ref <=3.0)
CO2 Content-Line Draw: 32 mmol/L (ref 25–29)
CO2 Content-Line Draw: 31 mmol/L (ref 25–29)
Carboxyhgb-Line Draw: 1 %
Carboxyhgb-Line Draw: 1.5 %
HCO3-Line Draw: 29 mmol/L (ref 24–28)
HCO3-Line Draw: 29 mmol/L (ref 24–28)
Methemoglobin-Line Draw: 0.3 % (ref 0.0–1.5)
Methemoglobin-Line Draw: 0.9 % (ref 0.0–1.5)
PCO2-Line Draw: 86 mm Hg (ref 41–51)
PCO2-Line Draw: 82 mm Hg (ref 41–51)
PH-Line Draw: 7.14 (ref 7.32–7.42)
PH-Line Draw: 7.15 (ref 7.32–7.42)
PO2-Line Draw: 76 mm Hg (ref 25–40)
PO2-Line Draw: 31 mm Hg (ref 25–40)
Reduced Hemoglobin-Line Draw: 9.5 % (ref 0.0–5.0)
Reduced Hemoglobin-Line Draw: 56.2 % (ref 0.0–5.0)

## 2016-11-14 LAB — URINE CULTURE

## 2016-11-14 LAB — CHLORIDE: Chloride: 98 mmol/L (ref 98–110)

## 2016-11-14 LAB — BUN: BUN: 11 mg/dL (ref 7–25)

## 2016-11-14 LAB — DIFFERENTIAL
Basophils Absolute: 37 /uL (ref 0–200)
Basophils Relative: 0.2 % (ref 0.0–1.0)
Eosinophils Absolute: 18 /uL (ref 15–500)
Eosinophils Relative: 0.1 % (ref 0.0–8.0)
Lymphocytes Absolute: 239 /uL (ref 850–3900)
Lymphocytes Relative: 1.3 % (ref 15.0–45.0)
Monocytes Absolute: 975 /uL (ref 200–950)
Monocytes Relative: 5.3 % (ref 0.0–12.0)
Neutrophils Absolute: 17130 /uL (ref 1500–7800)
Neutrophils Relative: 93.1 % (ref 40.0–80.0)
nRBC: 0 /100 WBC (ref 0–0)

## 2016-11-14 LAB — URINALYSIS W/RFL MICROSCOP, RFL CULTURE
Bilirubin, UA: NEGATIVE
Glucose, UA: >=500 mg/dL
Ketones, UA: 20 mg/dL
Nitrite, UA: NEGATIVE
Protein, UA: 100 mg/dL
RBC, UA: >100 /HPF (ref 0–3)
Specific Gravity, UA: 1.015 (ref 1.005–1.035)
Urobilinogen, UA: <2 mg/dL (ref 0.2–1.9)
WBC, UA: 63 /HPF (ref 0–5)
pH, UA: 6 (ref 5.0–8.0)

## 2016-11-14 LAB — ED BLOOD GAS PANEL, VENOUS
%HBO2, Venous: 86.6 % (ref 40.0–70.0)
Base Excess, Ven: 3.8 mmol/L (ref <=3.0)
CO2 Content, Venous: 42 mmol/L (ref 25–29)
Carboxyhemoglobin, Venous: 1.2 % (ref 0.0–2.0)
Free Calcium, WB: 5.14 mg/dL (ref 4.50–5.30)
Glucose: 157 mg/dL (ref 70–100)
HCO3, Ven: 38 mmol/L (ref 24–28)
Hematocrit. Blood Gas Panel: 44 % (ref 40–52)
Hemoglobin, Blood Gas Panel: 14.4 g/dL (ref 14.0–18.0)
Lactate, Ven: 0.7 mmol/L (ref 0.5–1.6)
Methemoglobin, Venous: 0.7 % (ref 0.0–1.5)
Potassium: 5 mEq/L (ref 3.5–5.3)
Reduced hemoglobin, Venous: 11.5 % (ref 0.0–5.0)
Sodium: 142 mEq/L (ref 136–146)
pCO2, Ven: 123 mm Hg (ref 41–51)
pH, Ven: 7.1 (ref 7.32–7.42)
pO2, Ven: 76 mm Hg (ref 25–40)

## 2016-11-14 LAB — ANTIBODY SCREEN
Antibody Screen: NEGATIVE
Antibody Screen: NEGATIVE

## 2016-11-14 LAB — PROTIME-INR
INR: 1 (ref 0.9–1.1)
INR: 1.3 — ABNORMAL HIGH (ref 0.9–1.1)
Protime: 13 seconds (ref 11.8–14.8)
Protime: 16 s — ABNORMAL HIGH (ref 11.8–14.8)

## 2016-11-14 LAB — TROPONIN I: Troponin I: 0.06 ng/mL — ABNORMAL HIGH (ref 0.00–0.03)

## 2016-11-14 LAB — APTT: aPTT: 29.1 s (ref 25.5–35.0)

## 2016-11-14 LAB — BLOOD CULTURE-PERIPHERAL
Culture Result: NO GROWTH
Culture Result: NO GROWTH

## 2016-11-14 LAB — LACTIC ACID: Lactate: 7.1 mmol/L (ref 0.5–2.2)

## 2016-11-14 LAB — MAGNESIUM: Magnesium: 1.7 mg/dL (ref 1.5–2.5)

## 2016-11-14 MED ORDER — heparin (porcine) injection 5,000 Units
5000 | Freq: Three times a day (TID) | INTRAMUSCULAR | Status: AC
Start: 2016-11-14 — End: 2016-11-16
  Administered 2016-11-15 – 2016-11-16 (×4): 5000 [IU] via SUBCUTANEOUS

## 2016-11-14 MED ORDER — rocuronium (ZEMURON) 10 mg/mL injection
10 | INTRAVENOUS | Status: AC
Start: 2016-11-14 — End: 2016-11-14

## 2016-11-14 MED ORDER — senna-docusate (SENNA-S) 8.6-50 mg per tablet 1 tablet
8.6-50 | Freq: Two times a day (BID) | ORAL | Status: AC
Start: 2016-11-14 — End: 2016-11-16
  Administered 2016-11-15 – 2016-11-16 (×3): 1 via ORAL

## 2016-11-14 MED ORDER — ipratropium-albuterol (DUO-NEB) 0.5 mg-3 mg(2.5 mg base)/3 mL nebulizer solution 3 mL
0.5 | Freq: Once | RESPIRATORY_TRACT | Status: AC
Start: 2016-11-14 — End: 2016-11-14

## 2016-11-14 MED ORDER — EPINEPHrine (ADRENALIN) 10 mg in sodium chloride 0.9% 250 mL infusion
1 | INTRAMUSCULAR | Status: AC | PRN
Start: 2016-11-14 — End: 2016-11-14
  Administered 2016-11-14: 13:00:00 10 via INTRAVENOUS

## 2016-11-14 MED ORDER — fentaNYL (SUBLIMAZE) injection 25 mcg
50 | INTRAMUSCULAR | Status: AC | PRN
Start: 2016-11-14 — End: 2016-11-15
  Administered 2016-11-14 – 2016-11-15 (×8): 25 ug via INTRAVENOUS

## 2016-11-14 MED ORDER — oxyCODONE (ROXICODONE) immediate release tablet 5 mg
5 | Freq: Four times a day (QID) | ORAL | Status: AC
Start: 2016-11-14 — End: 2016-11-15
  Administered 2016-11-14 – 2016-11-15 (×3): 5 mg via ORAL

## 2016-11-14 MED ORDER — fentaNYL (SUBLIMAZE) 50 mcg/mL injection
50 | INTRAMUSCULAR | Status: AC
Start: 2016-11-14 — End: 2016-11-14
  Administered 2016-11-14: 21:00:00 50 via INTRAVENOUS

## 2016-11-14 MED ORDER — ipratropium-albuterol (DUO-NEB) 0.5 mg-3 mg(2.5 mg base)/3 mL nebulizer solution
0.5 | RESPIRATORY_TRACT | Status: AC
Start: 2016-11-14 — End: 2016-11-14

## 2016-11-14 MED ORDER — norepinephrine (LEVOPHED) 0.064 mg/mL in sodium chloride 0.9 % 250 mL infusion
1 | INTRAVENOUS | Status: AC | PRN
Start: 2016-11-14 — End: 2016-11-14
  Administered 2016-11-14: 14:00:00 5 via INTRAVENOUS

## 2016-11-14 MED ORDER — oxyCODONE (ROXICODONE) immediate release tablet 5 mg
5 | Freq: Three times a day (TID) | ORAL | Status: AC
Start: 2016-11-14 — End: 2016-11-14

## 2016-11-14 MED ORDER — amiodarone (CORDARONE) injection
50 | INTRAVENOUS | Status: AC | PRN
Start: 2016-11-14 — End: 2016-11-14
  Administered 2016-11-14: 13:00:00 300 via INTRAVENOUS

## 2016-11-14 MED ORDER — sodium chloride 0.9 % infusion
INTRAVENOUS | Status: AC
Start: 2016-11-14 — End: 2016-11-14

## 2016-11-14 MED ORDER — EPINEPHrine (ADRENALIN) 0.1 mg/mL injection Syrg
0.1 | INTRAMUSCULAR | Status: AC | PRN
Start: 2016-11-14 — End: 2016-11-14
  Administered 2016-11-14 (×5): 1 via INTRAVENOUS

## 2016-11-14 MED ORDER — fentaNYL (SUBLIMAZE) 50 mcg/mL injection
50 | INTRAMUSCULAR | Status: AC
Start: 2016-11-14 — End: 2016-11-14

## 2016-11-14 MED ORDER — ceFAZolin (ANCEF) in D5W 50 mL 2 gram/50 mL
2 | INTRAVENOUS | Status: AC
Start: 2016-11-14 — End: 2016-11-14
  Administered 2016-11-14: 13:00:00 2 via INTRAVENOUS

## 2016-11-14 MED ORDER — norepinephrine (LEVOPHED) 0.064 mg/mL in sodium chloride 0.9 % 250 mL infusion
1 | INTRAVENOUS | Status: AC
Start: 2016-11-14 — End: 2016-11-16

## 2016-11-14 MED ORDER — ceFAZolin (ANCEF) IVPB 2 g in D5W (duplex)
2 | Freq: Once | INTRAVENOUS | Status: AC
Start: 2016-11-14 — End: 2016-11-14

## 2016-11-14 MED ORDER — sodium bicarbonate 8.4 % (1 mEq/mL) injection
8.4 | INTRAVENOUS | Status: AC | PRN
Start: 2016-11-14 — End: 2016-11-14
  Administered 2016-11-14: 13:00:00 50 via INTRAVENOUS

## 2016-11-14 MED ORDER — electrolyte-R (pH 7.4) (NORMOSOL-R pH 7.4) 1,000 mL bolus
Freq: Once | INTRAVENOUS | Status: AC
Start: 2016-11-14 — End: 2016-11-14
  Administered 2016-11-15: 02:00:00 via INTRAVENOUS

## 2016-11-14 MED ORDER — acetaminophen (TYLENOL) 325 MG tablet
325 | ORAL | Status: AC
Start: 2016-11-14 — End: 2016-11-14
  Administered 2016-11-14: 22:00:00 650 via ORAL

## 2016-11-14 MED ORDER — fentaNYL (SUBLIMAZE) injection
50 | INTRAMUSCULAR | Status: AC | PRN
Start: 2016-11-14 — End: 2016-11-14
  Administered 2016-11-14: 13:00:00 100 via INTRAVENOUS

## 2016-11-14 MED ORDER — fentaNYL (SUBLIMAZE) injection 50 mcg
50 | INTRAMUSCULAR | Status: AC | PRN
Start: 2016-11-14 — End: 2016-11-15
  Administered 2016-11-15 (×4): 50 ug via INTRAVENOUS

## 2016-11-14 MED ORDER — electrolyte-R (pH 7.4) (NORMOSOL-R pH 7.4) 1,000 mL bolus
Freq: Once | INTRAVENOUS | Status: AC
Start: 2016-11-14 — End: 2016-11-14
  Administered 2016-11-15: 04:00:00 via INTRAVENOUS

## 2016-11-14 MED ORDER — pantoprazole (PROTONIX) injection 40 mg
40 | Freq: Every day | INTRAVENOUS | Status: AC
Start: 2016-11-14 — End: 2016-11-17
  Administered 2016-11-15 – 2016-11-16 (×2): 40 mg via INTRAVENOUS

## 2016-11-14 MED ORDER — calcium chloride 100 mg/mL (10 %) injection
100 | INTRAVENOUS | Status: AC | PRN
Start: 2016-11-14 — End: 2016-11-14
  Administered 2016-11-14: 13:00:00 1 via INTRAVENOUS

## 2016-11-14 MED ORDER — etomidate (AMIDATE) 2 mg/mL injection
2 | INTRAVENOUS | Status: AC
Start: 2016-11-14 — End: 2016-11-14

## 2016-11-14 MED ORDER — acetaminophen (TYLENOL) tablet 650 mg
325 | Freq: Three times a day (TID) | ORAL | Status: AC
Start: 2016-11-14 — End: 2016-11-16
  Administered 2016-11-15 – 2016-11-16 (×4): 650 mg via ORAL

## 2016-11-14 MED FILL — FENTANYL (PF) 50 MCG/ML INJECTION SOLUTION: 50 50 mcg/mL | INTRAMUSCULAR | Qty: 2

## 2016-11-14 MED FILL — TYLENOL 325 MG TABLET: 325 325 mg | ORAL | Qty: 2

## 2016-11-14 MED FILL — SODIUM CHLORIDE 0.9 % INTRAVENOUS SOLUTION: INTRAVENOUS | Qty: 500

## 2016-11-14 MED FILL — SENNOSIDES 8.6 MG-DOCUSATE SODIUM 50 MG TABLET: 8.6-50 8.6-50 mg | ORAL | Qty: 1

## 2016-11-14 MED FILL — AMIDATE 2 MG/ML INTRAVENOUS SOLUTION: 2 2 mg/mL | INTRAVENOUS | Qty: 20

## 2016-11-14 MED FILL — OXYCODONE 5 MG TABLET: 5 5 MG | ORAL | Qty: 1

## 2016-11-14 MED FILL — IPRATROPIUM 0.5 MG-ALBUTEROL 3 MG (2.5 MG BASE)/3 ML NEBULIZATION SOLN: 0.5 0.5 mg-3 mg(2.5 mg base)/3 mL | RESPIRATORY_TRACT | Qty: 3

## 2016-11-14 MED FILL — ROCURONIUM 10 MG/ML INTRAVENOUS SOLUTION: 10 10 mg/mL | INTRAVENOUS | Qty: 2

## 2016-11-14 MED FILL — CEFAZOLIN 2 GRAM/50 ML IN DEXTROSE (ISO-OSMOTIC) INTRAVENOUS PIGGYBACK: 2 2 gram/50 mL | INTRAVENOUS | Qty: 50

## 2016-11-14 MED FILL — HEPARIN (PORCINE) 5,000 UNIT/ML INJECTION SOLUTION: 5000 5,000 unit/mL | INTRAMUSCULAR | Qty: 1

## 2016-11-14 MED FILL — LEVOPHED 1 MG/ML INTRAVENOUS SOLUTION: 1 1 mg/mL | INTRAVENOUS | Qty: 16

## 2016-11-14 NOTE — Unmapped (Signed)
[facility] ED Procedure Note        Emergency Department Procedures     Arterial Line  Date/Time: 12-06-2016 10:30 AM  Performed by: Lenore Manner  Authorized by: Hazle Quant   Consent: The procedure was performed in an emergent situation.  Patient identity confirmed: provided demographic data  Indications: hemodynamic monitoring  Location: right femoral  Needle gauge: 18  Seldinger technique: Seldinger technique used  Ultrasound guidance used during line insertion: yes  Number of attempts: 1  Complications: other (detail in comments)  Comments: Intra-arrest femoral a-line, US guided.  Shortly after line obtained, patient noted to have new scrotal swelling concerning for bleeding from line site.  Sand bag placed and roughly 30 minutes of pressure applied.  Discussed with vascular surgery who will see patient upon arrival to [facility].      Lenore Manner  Emergency Medicine PGY-3

## 2016-11-14 NOTE — Unmapped (Signed)
Pt arrives to ED via [facility] for SOB and AMS.  Pt is awake but not following instruction or verbally responsive.

## 2016-11-14 NOTE — Progress Notes (Signed)
Pt. Transported to CT with no complications.

## 2016-11-14 NOTE — Unmapped (Signed)
10mg  Etomidate administered IVP for induction of RSI.

## 2016-11-14 NOTE — Unmapped (Signed)
1mg  Epinephrine administered IVP.

## 2016-11-14 NOTE — Unmapped (Signed)
Problem: Non-violent, non-self-destructive restraints  Less restrictive alternative interventions will be considered prior to application of restraint devices.  Goal: Patient will be restrained only to maintain safety  Alternatives to restraints can include; moving the patient closer to the nurses station, video monitoring where available, medicating for pain, agitation or psychosis, psychosocial interventions, manipulation of environment, frequent toileting, diversional activities, bed alarms, having family members sit with patient, frequent reorientation, or repositioning.    Intervention: Attempt/consider less restrictive devices to maintain safety of patient.  Pt unresponsive on arrival to MICU this morning. Became more responsive and was placed in bilateral soft wrist restraints at 1200 to prevent pulling out ET tube and IVs. Pt assessed every 2 hours, repositioned and restraints loosened. Pt attempted to self extubate at 1700. Pt will remain in restraints at this time due to confusion, will continue to monitor.   ??

## 2016-11-14 NOTE — Unmapped (Signed)
[place] ED Procedure Note        Emergency Department Procedures     Intubation  Date/Time: 11/24/2016 10:23 AM  Performed by: Lenore Manner  Authorized by: Hazle Quant   Consent: The procedure was performed in an emergent situation.  Patient identity confirmed: provided demographic data  Time out: Immediately prior to procedure a time out was called to verify the correct patient, procedure, equipment, support staff and site/side marked as required.  Indications: respiratory failure  Intubation method: direct  Sedatives: etomidate  Paralytic: succinylcholine  Laryngoscope size: Mac 4  Tube size: 7.5 mm  Tube type: cuffed  Number of attempts: 1  Cricoid pressure: yes  Cords visualized: yes  Post-procedure assessment: chest rise,  ETCO2 monitor and CO2 detector  Breath sounds: equal and absent over the epigastrium  Cuff inflated: yes  ETT to teeth: 23 cm  Tube secured with: ETT holder  Chest x-ray interpreted by me.  Chest x-ray findings: endotracheal tube in appropriate position  Patient tolerance: Patient tolerated the procedure well with no immediate complications  Complications: none      Lenore Manner  Emergency Medicine PGY-3

## 2016-11-14 NOTE — Unmapped (Signed)
Scalpel decompression performed on L side, mid-axillary by Dr. Clarene Reamer.

## 2016-11-14 NOTE — Unmapped (Signed)
Problem: Non-violent, non-self-destructive restraints  Less restrictive alternative interventions will be considered prior to application of restraint devices.   Goal: Patient will be restrained only to maintain safety  Alternatives to restraints can include; moving the patient closer to the nurses station, video monitoring where available, medicating for pain, agitation or psychosis, psychosocial interventions, manipulation of environment, frequent toileting, diversional activities, bed alarms, having family members sit with patient, frequent reorientation, or repositioning.   Pt in BSW restraints, intubated and has bilateral chest tubes. Pt able to follow commands. Pt and family educated on purpose of restraints. Will continue to monitor for pt to meet discontinuation criteria.

## 2016-11-14 NOTE — Unmapped (Signed)
100mg  Rocuronium administered IV push.

## 2016-11-14 NOTE — Unmapped (Signed)
I visited with Derrick Garner's family today following a request from nursing on their behalf. Spiritual/emotional support provided in MICU waiting room. 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 Main concerns are for elderly mother of pt in assisted living and daughter of pt who lives in [place].  Spiritual care will continue to follow as needed.    Rev. Resa Miner. Hayden Rasmussen  [place], [facility]  [phone]

## 2016-11-14 NOTE — Nursing Note (Signed)
Mr. Enterline arrived at 1130 to MICU #15 via [facility]irc[facility] from [facility] after getting report from [place] at 1008. Transferred to the bed and attached to the bedside monitor, see initial assessment and vitals on the flowsheet.

## 2016-11-14 NOTE — Unmapped (Signed)
Problem: Acute Pain  Patient's pain progressing toward patient's stated pain goal  Goal: Patient displays improved well-being such as baseline levels for pulse, BP, respirations and relaxed muscle tone or body posture    Intervention: Anticipate the need for pain relief  Derrick Garner is intubated and has bilateral chest tubes. He is now receiving scheduled oxycodone and Tylenol and can get PRN Fentanyl IVP 25-50 mcg as needed.

## 2016-11-14 NOTE — Unmapped (Addendum)
MICU fellow Dr. Welton Flakes and Dr. Willaim Bane are at bedside speaking with [name] and his sister is the bedside discussing the events that occurred today. [name] is able to write questions and interact with his doctors.  [name] daughter was called and we are using the cell phone's Facetime feature to speak and share visual information with his daughter Derrick Garner in [place].

## 2016-11-14 NOTE — Unmapped (Signed)
CPR started with loss of pulse.  1amp of Epinephrine administered by Dr. Maryjean Ka.

## 2016-11-14 NOTE — Unmapped (Signed)
Scalpel decompression performed on R side, mid-axillary.

## 2016-11-14 NOTE — Unmapped (Signed)
[place] ED Procedure Note        Emergency Department Procedures     Chest Tube  Date/Time: 11/10/2016 10:29 AM  Performed by: Lenore Manner  Authorized by: Hazle Quant   Consent: The procedure was performed in an emergent situation.  Patient identity confirmed: provided demographic data  Time out: Immediately prior to procedure a time out was called to verify the correct patient, procedure, equipment, support staff and site/side marked as required.  Indications: pneumothorax  Preparation: skin prepped with Betadine  Placement location: left lateral  Tube size: 24 [place]  Dissection instrument: finger  Tube connected to: suction  Suture material: 2-0 silk  Dressing: 4x4 sterile gauze  Post-insertion x-ray findings: tube in good position  Patient tolerance: Patient tolerated the procedure well with no immediate complications  Complications: other (detail in comments)  Comments: Intra-arrest finger thoracostomy, tube placed upon ROSC, sterility compromised         Lenore Manner  Emergency Medicine PGY-3

## 2016-11-14 NOTE — Unmapped (Signed)
Respiratory at bedside, pt placed on Bipap.  No change in mental status.

## 2016-11-14 NOTE — Unmapped (Signed)
Department of Internal Medicine  MICU History & Physical      Jaxin Fulfer           [id number]  11/19/2016         5:05 PM  [id number]   LOS: 0 days     Attending: Bari Mantis, MD    Chief Complaint     Cardiac Arrest     History of Present Illness     Rodd Heft is 66 y.o. male with COPD Stage 4 (baseline oxygen 3-4 and FEV1 20% in 2017), BPH s/p TURP c/b recurrent UTIs, several bladder stones, and bladder mucosa suspicious for bladder cancer who was transferred from [facility] ED for further management following cardiac arrest.    History obtained from pt's brother, who he lives with.  Pt's brother reports that pt felt feverish couple days prior to admission but otherwise did not report SOB or cough. Recently discharged on 8/12 following suprapubic tube placement, cystolitholapxy, TURP, and bladder biopsy.  Pt's brother reports pt was concerned about catheter infection after getting the dressing wet while taking a shower.  On the morning of admission, pt texted his brother Help.  Pt's brother noticed his oxygen saturations were in the low 80s and called 911. Pt's brother reports pt was alert and mentating appropriately when EMS came to bring the pt to [facility] [place].    At [facility], initially placed on BIPAP but due to AMS and respiratory distress, he was intubated.  Subsequently no pulse on exam and CPR initiated. Telemetry concerning for V.Fib and shocked 4 times and epi x 1 administered. VBG noteable for significant metabolic acidosis (7.05/120 --> 7.17/80). Subsequent to chest compressions and intubation, pt noted to have bilateral spontaneous pneumthoraces and bilateral chest tubes placed. Started on levo for soft pressures and transferred via air care to [facility] MICU.     Review of Systems     ROS  Unable to obtain - intubated    Past Medical History     Past Medical History:   Diagnosis Date   ??? Anxiety    ??? Benign prostatic hyperplasia    ??? Bladder stones    ??? COPD (chronic obstructive pulmonary  disease) with emphysema (CMS Dx)    ??? History of alcohol abuse    ??? Hypoxemia requiring supplemental oxygen    ??? Tobacco abuse        Past Surgical History     Past Surgical History:   Procedure Laterality Date   ??? COLONOSCOPY W/ BIOPSIES AND POLYPECTOMY     ??? TRANSURETHRAL RESECTION OF PROSTATE N/A 11/05/2016    Procedure: transurethral resection of the prostate (TURP)with Cystolitholapaxy, SUPRAPUBIC CATHETER;  Surgeon: Gillis Ends;  Location: [facility] OR;  Service: Urology;  Laterality: N/A;       Family History     Family History   Problem Relation Age of Onset   ??? Cancer Father    ??? Lung Cancer Father    ??? COPD Maternal Uncle        Social History     Social History     Social History   ??? Marital status: Divorced     Spouse name: N/A   ??? Number of children: N/A   ??? Years of education: N/A     Occupational History   ??? Not on file.     Social History Main Topics   ??? Smoking status: Former Smoker     Packs/day: 1.50     Years: 45.00  Types: Cigarettes, Cigars     Quit date: 12/28/2015   ??? Smokeless tobacco: Never Used   ??? Alcohol use No      Comment: quit 48yrs ago, hx of heavy alcohol abuse prior   ??? Drug use: Unknown     Types: Marijuana      Comment: Hx of marijuana use not for last 65yrs   ??? Sexual activity: Not on file     Other Topics Concern   ??? Caffeine Use Yes   ??? Occupational Exposure No   ??? Exercise No   ??? Seat Belt Yes     Social History Narrative   ??? No narrative on file       Medications     Home Meds:  Prior to Admission medications    Medication Sig Start Date End Date Taking? Authorizing Provider   albuterol (PROVENTIL) 2.5 mg /3 mL (0.083 %) nebulizer solution Inhale 2.5 mg by nebulization every 6 hours as needed.           02/10/16   Historical Provider, MD   albuterol (PROVENTIL;VENTOLIN;PROAIR) 90 mcg/actuation inhaler Inhale 2 puffs into the lungs every 4 hours as needed for Wheezing. 04/21/16   Irven Shelling, CNP   ALPRAZolam Prudy Feeler) 1 MG tablet Take 1 mg by mouth 2 times a day.            01/20/16   Historical Provider, MD   ALPRAZolam Prudy Feeler) 1 MG tablet Take 2 mg by mouth after lunch. Between 1400-1500    Historical Provider, MD   diphenhydrAMINE (BENADRYL) 25 mg capsule Take 25-50 mg by mouth if needed.              Historical Provider, MD   fluticasone-salmeterol (ADVAIR) 250-50 mcg/dose diskus inhaler Inhale 2 puffs into the lungs 2 times a day.              Historical Provider, MD   food supplemt, lactose-reduced (ENSURE ORIGINAL) Liqd Take 1 Can by mouth 3 times a day. vanilla    Historical Provider, MD   ibuprofen (ADVIL) 200 MG tablet Take 400 mg by mouth if needed.              Historical Provider, MD   oxyCODONE (ROXICODONE) 5 MG immediate release tablet Take 1 tablet (5 mg total) by mouth every 6 hours as needed for up to 7 days. 11/07/16 11/18/2016  Arlys John McGillick   senna-docusate (SENNA-S) 8.6-50 mg per tablet Take 1 tablet by mouth 2 times a day. 11/07/16   Arlys John McGillick   tamsulosin (FLOMAX) 0.4 mg Cap TAKE ONE CAPSULE BY MOUTH ONCE DAILY AT BEDTIME 10/11/16   Courtney Plattner   tiotropium (SPIRIVA) 18 mcg Inhale 1 capsule (18 mcg total) into the lungs daily. 05/26/16   Ivery Quale, MD          Inpatient Meds:  Scheduled:  ??? fentaNYL       ??? pantoprazole  40 mg Intravenous DAILY 0600   ??? senna-docusate  1 tablet Oral BID     Continuous:  ??? norepinephrine infusion 6 mcg/min (11/21/2016 1537)     ZOX:[id number], fentaNYL    NURSING    #Peripheral:  Peripheral IV 11/26/2016 Right Antecubital (Active)   Site Assessment No problems identified 11/15/2016 11:34 AM   Line Status Blood return noted;Flushed;Saline locked 11/20/2016 11:34 AM   Dressing Status Clean;Dry;Intact 11/06/2016 11:34 AM       Peripheral IV 10/27/2016 Left  (Active)   Site Assessment  No problems identified 07-Dec-2016 11:34 AM   Line Status Blood return noted;Flushed;Saline locked 12-07-16 11:34 AM   Dressing Status Clean;Dry;Intact 2016/12/07 11:34 AM       Peripheral IV 12/07/2016 Right  (Active)   Site Assessment No  problems identified 12-07-16 12:43 PM   Line Status Flushed;Blood return noted;Saline locked;Positional December 07, 2016 12:43 PM   Dressing Status Clean;Dry;Intact 12/07/16 12:43 PM   Dressing Intervention New dressing 12/07/2016 12:43 PM     #Tubes/Drains/HD Access:  Chest Tube 1 Right Midaxillary;Fourth intercostal space 24 Fr. (Active)   Drainage System To water seal 2016/12/07 12:14 PM   Chest Tube Air Leak Yes 12-07-16 11:34 AM   Patency Intervention Tip/tilt 07-Dec-2016 11:34 AM   Drainage Description Bright red 12/07/16 11:34 AM   Dressing Status Intact;Old drainage 12-07-2016 11:34 AM   Site Assessment Other (Comment) 2016-12-07 11:34 AM   Surrounding Skin Intact December 07, 2016 11:34 AM   Output (mL) 35 mL 2016-12-07 11:34 AM       Chest Tube 2 Left Fourth intercostal space;Midaxillary 24 Fr. (Active)   Drainage System To water seal 12-07-16 12:14 PM   Chest Tube Air Leak Yes 12-07-16 11:34 AM   Patency Intervention Tip/tilt December 07, 2016 11:34 AM   Drainage Description Bright red 12/07/2016 11:34 AM   Dressing Status Intact;Old drainage 12/07/2016 11:34 AM   Site Assessment Other (Comment) 12/07/2016 11:34 AM   Surrounding Skin Intact 2016-12-07 11:34 AM   Output (mL) 10 mL Dec 07, 2016 11:34 AM       Closed/Suction Drain (Active)       Closed/Suction Drain Left Chest 24 Fr. (Active)       IUC (Foley) Triple-lumen (3-Way) 14 Fr. (Active)       Suprapubic Catheter Latex 16 Fr. (Active)       Suprapubic Catheter (Active)   Site Assessment Intact;Flush (at skin level) 2016/12/07 11:34 AM   Dressing Status Removed 12-07-16 11:34 AM   Dressing Type Other (Comment) 12-07-16 11:34 AM   Collection Container Standard drainage bag December 07, 2016 11:34 AM   Securement Method Securement Device 2016/12/07 11:34 AM   Reason for Continuing Urinary Catheterization past POD 1 Relieve urinary obstruction and/or retention 2016/12/07 11:34 AM   Output (mL) 300 mL 2016-12-07  2:31 PM        Foley - strict I/Os for critically ill patient    Drips  ???  norepinephrine infusion 6 mcg/min (07-Dec-2016 1537)       In/Out:    Intake/Output Summary (Last 24 hours) at 2016-12-07 1705  Last data filed at Dec 07, 2016 1431   Gross per 24 hour   Intake                0 ml   Output              345 ml   Net             -345 ml       No intake/output data recorded.      RESPIRATORY  Ventilator Settings:  Vent Mode: PC-AC  FiO2:  [59 %-100 %] 66 %  S RR:  [20] 20  S VT:  [400 mL] 400 mL  PEEP/CPAP:  [5 cm H20-10 cm H20] 8 cm H20    Last blood gas      Lab Dec 07, 2016  0830   PCO2, VENOUS 123*       Lactate:   Recent Labs      12-07-16   0830  December 07, 2016   1211  LACTATE  0.7  7.1*       Mentation  CAM: Overall CAM-ICU : No Delirium  RASS: [place] Agitation Sedation Scale: 0     PHYSICAL EXAM  Vital signs:   Temp:  [92.8 ??F (33.8 ??C)-97.6 ??F (36.4 ??C)] 94.3 ??F (34.6 ??C)  Heart Rate:  [80-150] 105  Resp:  [13-34] 16  BP: (70-179)/(19-99) 107/74  FiO2:  [59 %-100 %] 66 %  BP 107/74    Pulse 105    Temp (!) 94.3 ??F (34.6 ??C) (Axillary)    Resp 16    Wt 114 lb 10.2 oz (52 kg)    SpO2 92%    BMI 16.45 kg/m??     Temp (24hrs), Avg:94.6 ??F (34.8 ??C), Min:92.8 ??F (33.8 ??C), Max:97.6 ??F (36.4 ??C)    Patient Vitals for the past 4 hrs:   BP Temp Temp src Pulse Resp SpO2   12-09-16 1645 107/74 - - 105 16 92 %   2016/12/09 1630 109/74 (!) 94.3 ??F (34.6 ??C) Axillary 106 22 93 %   12-09-2016 1615 (!) 127/99 - - 103 23 92 %   12-09-2016 1608 - - - 104 21 92 %   12-09-16 1605 - - - - - 93 %   December 09, 2016 1600 126/80 - - 102 23 94 %   Dec 09, 2016 1530 113/70 - - 98 24 93 %   12/09/2016 1515 117/84 - - 95 23 (!) 86 %   December 09, 2016 1500 (!) 89/56 - - 90 20 (!) 87 %   2016-12-09 1445 - - - 89 20 94 %   12-09-2016 1443 112/82 (!) 92.8 ??F (33.8 ??C) Oral 89 20 93 %   12/09/16 1430 - - - 88 20 94 %   09-Dec-2016 1415 107/78 - - - - -   2016-12-09 1400 91/65 - - 80 20 94 %   December 09, 2016 1345 97/61 - - 81 20 95 %   Dec 09, 2016 1330 (!) 78/58 - - 86 20 95 %   2016/12/09 1315 93/63 - - 83 20 94 %       BP  Min: 70/19  Max: 179/82  No Data Recorded  Temp   Avg: 94.6 ??F (34.8 ??C)  Min: 92.8 ??F (33.8 ??C)  Max: 97.6 ??F (36.4 ??C)  Pulse  Avg: 101.7  Min: 80  Max: 150  Resp  Avg: 21.5  Min: 13  Max: 34  SpO2  Avg: 93.5 %  Min: 68 %  Max: 100 %  BP  Min: 70/19  Max: 179/82  MAP (mmHg)  Avg: 79.5  Min: 29  Max: 148  MAP (mmHg)  Avg: 79.5  Min: 29  Max: 148    Physical Exam   Gen: chronically ill appearing, mal-nourished, caucasian male intubated    HEENT: atraumatic, no obvious lesions/abrasions, pupils equal and reactive to light bilaterally  CV: tachycardic, regular rhythm, no murmurs appreciated  Chest Wall: crepitus throughout chest cavity  Lungs: coarse, diminished, exam noteable for subcutaneous emphysema from neck to lower abdomen   Abdomen:  exam noteable for subcutaneous emphysema from neck to lower abdomen   Extremities: 1+ pitting edema in LE bilaterally   Neuro: alert, answering questions by writing on piece of paper, pupils equal and reactive to light, wiggles toes to commands, moves both upper extremities spontaneously     LABS      Lab Dec 09, 2016  1211 December 09, 2016  0830   WBC 18.4* 21.1*   HEMOGLOBIN 11.7* 13.6   HEMATOCRIT 35.3* 41.3  PLATELETS 343 629*   MEAN CORPUSCULAR VOLUME 90.4 89.2         Lab 12-07-16  1211 12-07-2016  0830   SODIUM 144 142   POTASSIUM 3.6 5.0   CHLORIDE 101 98   CO2 28  --    BUN 13 11   CREATININE 1.02 0.87   GLUCOSE 266* 157*   CALCIUM 9.5  --    MAGNESIUM 1.7  --    PHOSPHORUS 3.3  --              Lab Dec 07, 2016  1211 12/07/16  0830   LACTATE BLOOD VENOUS  --  0.7   LACTATE 7.1*  --          Lab 12-07-2016  1211   ALK PHOS 51   AST 80*   ALT 58*   BILIRUBIN TOTAL 0.9   BILIRUBIN DIRECT 0.23   TOTAL PROTEIN 5.7*   ALBUMIN 2.8*   2.8*         Lab December 07, 2016  1211 Dec 07, 2016  0830   INR 1.3* 1.0   PROTHROMBIN TIME 16.0* 13.0           IMAGING  CT Chest WO contrast   Final Result   IMPRESSION:      Chest:   Large bilateral pneumothoraces with bilateral chest tubes in place. The right chest tube transverses the right lung parenchyma.      Small  right pleural effusion.      Nondisplaced sternal and bilateral rib fractures as above, likely related to chest compressions.      Consolidative and groundglass opacities predominantly in the lower lobes with adjacent areas of nodularity may be related to a combination of atelectasis and pneumonia, including aspiration.      New 10 mm nodule at the right lung base, indeterminate. Recommend a short-term follow up examination in 3 months.      Severe pulmonary emphysema.      Abdomen/pelvis:   Pneumoperitoneum with no definite evidence to suggest bowel perforation, likely related to extension from bilateral pneumothoraces.      Punctate bilateral nonobstructing renal calculi.      Circumferential bladder wall thickening, with slightly more asymmetric thickening near the bladder dome. Although this may be possibly related to recent urologic procedure, given the asymmetry an underlying bladder malignancy should be considered. Consider correlation with direct visualization.      Trace ascites and extensive subcutaneous emphysema.      Approved by Rogue Bussing on 2016-12-07 2:54 PM EDT      I have personally reviewed the images and I agree with this report.      Report Verified by: Blenda Peals, M.D. at 12/07/2016 3:13 PM EDT      CT Abdomen and Pelvis WO IV contrast   Final Result   IMPRESSION:      Chest:   Large bilateral pneumothoraces with bilateral chest tubes in place. The right chest tube transverses the right lung parenchyma.      Small right pleural effusion.      Nondisplaced sternal and bilateral rib fractures as above, likely related to chest compressions.      Consolidative and groundglass opacities predominantly in the lower lobes with adjacent areas of nodularity may be related to a combination of atelectasis and pneumonia, including aspiration.      New 10 mm nodule at the right lung base, indeterminate. Recommend a short-term follow up examination in 3 months.      Severe pulmonary  emphysema.       Abdomen/pelvis:   Pneumoperitoneum with no definite evidence to suggest bowel perforation, likely related to extension from bilateral pneumothoraces.      Punctate bilateral nonobstructing renal calculi.      Circumferential bladder wall thickening, with slightly more asymmetric thickening near the bladder dome. Although this may be possibly related to recent urologic procedure, given the asymmetry an underlying bladder malignancy should be considered. Consider correlation with direct visualization.      Trace ascites and extensive subcutaneous emphysema.      Approved by Rogue Bussing on 21-Nov-2016 2:54 PM EDT      I have personally reviewed the images and I agree with this report.      Report Verified by: Blenda Peals, M.D. at November 21, 2016 3:13 PM EDT      CT Head WO contrast   Final Result   IMPRESSION:      No evidence of intracranial mass lesion, hemorrhage or mass effect.      Mild to moderate small vessel ischemic disease.      Report Verified by: Drake Leach, M.D. at 11/21/2016 2:50 PM EDT      X-ray Portable Chest   Final Result   IMPRESSION:    Chest:   Apparent enlargement of large right and small left pneumothoraces with bilateral chest tubes in place, although direct comparison is limited by differences in projection.      Multifocal airspace disease, not significantly changed.      Abdomen:   Large area of lucency in the right hemiabdomen is of uncertain significance, possibly artifactual. No definite evidence of pneumoperitoneum. Nonobstructive bowel gas pattern.      Pelvis:   Apparent soft tissue defect involving the scrotum and medial thighs may be artifactual, postoperative or related to infection.      Approved by Rogue Bussing on 11-21-2016 12:46 PM EDT      I have personally reviewed the images and I agree with this report.      Report Verified by: Blenda Peals, M.D. at 11-21-2016 12:59 PM EDT      X-ray Portable Abdomen AP view   Final Result   IMPRESSION:    Chest:   Apparent enlargement of  large right and small left pneumothoraces with bilateral chest tubes in place, although direct comparison is limited by differences in projection.      Multifocal airspace disease, not significantly changed.      Abdomen:   Large area of lucency in the right hemiabdomen is of uncertain significance, possibly artifactual. No definite evidence of pneumoperitoneum. Nonobstructive bowel gas pattern.      Pelvis:   Apparent soft tissue defect involving the scrotum and medial thighs may be artifactual, postoperative or related to infection.      Approved by Rogue Bussing on 11/21/16 12:46 PM EDT      I have personally reviewed the images and I agree with this report.      Report Verified by: Blenda Peals, M.D. at 11/21/16 12:59 PM EDT      X-ray Pelvis 1 or 2-views   Final Result   IMPRESSION:    Chest:   Apparent enlargement of large right and small left pneumothoraces with bilateral chest tubes in place, although direct comparison is limited by differences in projection.      Multifocal airspace disease, not significantly changed.      Abdomen:   Large area of lucency in the right hemiabdomen is of uncertain significance, possibly artifactual. No  definite evidence of pneumoperitoneum. Nonobstructive bowel gas pattern.      Pelvis:   Apparent soft tissue defect involving the scrotum and medial thighs may be artifactual, postoperative or related to infection.      Approved by Rogue Bussing on Nov 19, 2016 12:46 PM EDT      I have personally reviewed the images and I agree with this report.      Report Verified by: Blenda Peals, M.D. at 11/19/2016 12:59 PM EDT          Ct Abdomen And Pelvis Wo Iv Contrast    Result Date: November 19, 2016  CT CHEST WO CONTRAST, CT ABDOMEN AND PELVIS WO IV CONTRAST dated 11/19/2016 1:57 PM EDT HISTORY: Found down, status post CPR, recent intervention for bladder stones/outlet obstruction COMPARISON:  Radiographs from the same date TECHNIQUE:  Helically acquired CT images were obtained from the lung  apices through the upper abdomen and reconstructed to a 1 and 5 mm slice thickness. Subsequent axial images were obtained from the lung bases through the pelvis and reconstructed to 3 mm slice thickness. No intravenous contrast was administered. Coronal and sagittal reformats were performed. Axial MIP reconstructions were performed through the chest.  FINDINGS:  Chest: Endotracheal tube tip terminates 5.5 cm above the carina. The central airways are patent. There are scattered areas of mucus plugging within the distal airways within the lower lobes bilaterally. There is severe emphysema. There are large bilateral pneumothoraces with bilateral chest tubes in place. There is additionally a small right pleural effusion. The right chest tube approaches via the fourth-fifth interspace anterolaterally and courses superiorly/posteriorly, transversing the lung parenchyma and terminating in the posterior right mid hemithorax. The left chest tube approaches via the lateral fifth-6 interspace and courses posteriorly/superiorly through the major fissure, terminating at the left apex. There is consolidative opacity within the dependent portions of the lungs bilaterally, likely atelectasis. Consolidation within both lower lobes with adjacent areas of ground glass opacities and mild nodularity, right greater than left. New irregular 10 mm nodule at the right lung base (image 383 series 5). Hyperdense material within the coronary arteries likely represents atherosclerotic calcification and/or stents. There is mild atherosclerotic calcification of the aorta and its branches. The heart and great vessels are otherwise normal. No suspicious lymphadenopathy is identified. There is a nondisplaced fracture through the mid sternum. There are nondisplaced fractures involving the anterolateral right third, posterior left first, lateral left second through fourth, and anterior left fifth and sixth ribs. There is extensive diffuse subcutaneous  emphysema. Abdomen/pelvis: 7 mm hypodense lesion is noted in the right hepatic lobe on series 7 image 40, too small to characterize. The liver, gallbladder, spleen, pancreas, and adrenal glands are otherwise unremarkable. The left kidney is asymmetrically atrophic. Punctate nonobstructing calculi are noted within the kidneys bilaterally. There is no evidence of hydronephrosis or hydroureter. A suprapubic catheter and antidependent gas are noted within the bladder. There is mild circumferential bladder wall thickening, however slightly more asymmetric near the bladder dome. The prostate is enlarged. The small and large bowel are normal in caliber. There is no evidence of obstruction. Enteric tube terminates within the gastric body. There is pneumoperitoneum throughout the abdomen. No definite areas of pneumatosis or bowel perforation are identified. Noncontrast evaluation of the vascular structures demonstrates severe atherosclerotic calcification of the aorta and its branches. No suspicious lymphadenopathy is identified. Trace free fluid is scattered throughout the abdomen and pelvis. No suspicious bone lesions are identified. There is diffuse subcutaneous emphysema.  IMPRESSION: Chest: Large bilateral pneumothoraces with bilateral chest tubes in place. The right chest tube transverses the right lung parenchyma. Small right pleural effusion. Nondisplaced sternal and bilateral rib fractures as above, likely related to chest compressions. Consolidative and groundglass opacities predominantly in the lower lobes with adjacent areas of nodularity may be related to a combination of atelectasis and pneumonia, including aspiration. New 10 mm nodule at the right lung base, indeterminate. Recommend a short-term follow up examination in 3 months. Severe pulmonary emphysema. Abdomen/pelvis: Pneumoperitoneum with no definite evidence to suggest bowel perforation, likely related to extension from bilateral pneumothoraces.  Punctate bilateral nonobstructing renal calculi. Circumferential bladder wall thickening, with slightly more asymmetric thickening near the bladder dome. Although this may be possibly related to recent urologic procedure, given the asymmetry an underlying bladder malignancy should be considered. Consider correlation with direct visualization. Trace ascites and extensive subcutaneous emphysema. Approved by Rogue Bussing on 11-24-16 2:54 PM EDT I have personally reviewed the images and I agree with this report. Report Verified by: Blenda Peals, M.D. at 11/24/2016 3:13 PM EDT    Ct Head Wo Contrast    Result Date: 24-Nov-2016  CT HEAD WO CONTRAST performed on 11/24/16 1:57 PM EDT Indications:ams no constrast; Comparisons:  none Technique: Multidetector CT head was performed without intravenous contrast Findings: There is mild-to-moderate periventricular white matter disease, likely related to small vessel ischemia. The ventricles are normal in caliber and midline in location with no mass effect or midline shift. No evidence of intracranial hemorrhage. Midline structures appear normal. Visualized paranasal sinuses, mastoids and the bony calvarium is normal. There is air in bilateral parapharyngeal space along with scattered foci of air in the posterior cervical space, incompletely visualized.     IMPRESSION: No evidence of intracranial mass lesion, hemorrhage or mass effect. Mild to moderate small vessel ischemic disease. Report Verified by: Drake Leach, M.D. at 24-Nov-2016 2:50 PM EDT    Ct Chest Wo Contrast    Result Date: 11/24/16  CT CHEST WO CONTRAST, CT ABDOMEN AND PELVIS WO IV CONTRAST dated 24-Nov-2016 1:57 PM EDT HISTORY: Found down, status post CPR, recent intervention for bladder stones/outlet obstruction COMPARISON:  Radiographs from the same date TECHNIQUE:  Helically acquired CT images were obtained from the lung apices through the upper abdomen and reconstructed to a 1 and 5 mm slice thickness. Subsequent  axial images were obtained from the lung bases through the pelvis and reconstructed to 3 mm slice thickness. No intravenous contrast was administered. Coronal and sagittal reformats were performed. Axial MIP reconstructions were performed through the chest.  FINDINGS:  Chest: Endotracheal tube tip terminates 5.5 cm above the carina. The central airways are patent. There are scattered areas of mucus plugging within the distal airways within the lower lobes bilaterally. There is severe emphysema. There are large bilateral pneumothoraces with bilateral chest tubes in place. There is additionally a small right pleural effusion. The right chest tube approaches via the fourth-fifth interspace anterolaterally and courses superiorly/posteriorly, transversing the lung parenchyma and terminating in the posterior right mid hemithorax. The left chest tube approaches via the lateral fifth-6 interspace and courses posteriorly/superiorly through the major fissure, terminating at the left apex. There is consolidative opacity within the dependent portions of the lungs bilaterally, likely atelectasis. Consolidation within both lower lobes with adjacent areas of ground glass opacities and mild nodularity, right greater than left. New irregular 10 mm nodule at the right lung base (image 383 series 5). Hyperdense material within the coronary arteries likely represents atherosclerotic  calcification and/or stents. There is mild atherosclerotic calcification of the aorta and its branches. The heart and great vessels are otherwise normal. No suspicious lymphadenopathy is identified. There is a nondisplaced fracture through the mid sternum. There are nondisplaced fractures involving the anterolateral right third, posterior left first, lateral left second through fourth, and anterior left fifth and sixth ribs. There is extensive diffuse subcutaneous emphysema. Abdomen/pelvis: 7 mm hypodense lesion is noted in the right hepatic lobe on series  7 image 40, too small to characterize. The liver, gallbladder, spleen, pancreas, and adrenal glands are otherwise unremarkable. The left kidney is asymmetrically atrophic. Punctate nonobstructing calculi are noted within the kidneys bilaterally. There is no evidence of hydronephrosis or hydroureter. A suprapubic catheter and antidependent gas are noted within the bladder. There is mild circumferential bladder wall thickening, however slightly more asymmetric near the bladder dome. The prostate is enlarged. The small and large bowel are normal in caliber. There is no evidence of obstruction. Enteric tube terminates within the gastric body. There is pneumoperitoneum throughout the abdomen. No definite areas of pneumatosis or bowel perforation are identified. Noncontrast evaluation of the vascular structures demonstrates severe atherosclerotic calcification of the aorta and its branches. No suspicious lymphadenopathy is identified. Trace free fluid is scattered throughout the abdomen and pelvis. No suspicious bone lesions are identified. There is diffuse subcutaneous emphysema.     IMPRESSION: Chest: Large bilateral pneumothoraces with bilateral chest tubes in place. The right chest tube transverses the right lung parenchyma. Small right pleural effusion. Nondisplaced sternal and bilateral rib fractures as above, likely related to chest compressions. Consolidative and groundglass opacities predominantly in the lower lobes with adjacent areas of nodularity may be related to a combination of atelectasis and pneumonia, including aspiration. New 10 mm nodule at the right lung base, indeterminate. Recommend a short-term follow up examination in 3 months. Severe pulmonary emphysema. Abdomen/pelvis: Pneumoperitoneum with no definite evidence to suggest bowel perforation, likely related to extension from bilateral pneumothoraces. Punctate bilateral nonobstructing renal calculi. Circumferential bladder wall thickening, with  slightly more asymmetric thickening near the bladder dome. Although this may be possibly related to recent urologic procedure, given the asymmetry an underlying bladder malignancy should be considered. Consider correlation with direct visualization. Trace ascites and extensive subcutaneous emphysema. Approved by Rogue Bussing on 11/23/16 2:54 PM EDT I have personally reviewed the images and I agree with this report. Report Verified by: Blenda Peals, M.D. at Nov 23, 2016 3:13 PM EDT    X-ray Portable Chest    Result Date: November 23, 2016  XR PORTABLE ABDOMEN AP, XR PELVIS 1 OR 2-VIEWS, XR PORTABLE CHEST dated November 23, 2016 11:42 AM EDT HISTORY: Found down, status post CPR, recent intervention for bladder stones/outlet obstruction COMPARISON: Chest radiograph 2 hours prior FINDINGS: Chest: Bilateral large bore chest tubes are again noted. Large right and small left pneumothoraces appear slightly enlarged, although direct comparison is limited due to differences in positioning. Widespread subcutaneous emphysema is again noted. Endotracheal tube tip projects approximately 7.5 cm above the carina. The cardiomediastinal silhouette is within normal limits. Diffuse multifocal airspace opacities are not significantly changed. Abdomen: NG tube tip projects over the gastric body. Several nondilated bowel loops are scattered throughout the abdomen. Lucent interface involving the right hemiabdomen is of uncertain significance, possibly artifactual. Pelvis: A single view is provided for review. A suprapubic catheter is noted. No acute fractures or dislocations are identified. Apparent soft tissue defect involving the scrotum and medial thighs may be artifactual, postoperative or related to infection.  IMPRESSION: Chest: Apparent enlargement of large right and small left pneumothoraces with bilateral chest tubes in place, although direct comparison is limited by differences in projection. Multifocal airspace disease, not significantly  changed. Abdomen: Large area of lucency in the right hemiabdomen is of uncertain significance, possibly artifactual. No definite evidence of pneumoperitoneum. Nonobstructive bowel gas pattern. Pelvis: Apparent soft tissue defect involving the scrotum and medial thighs may be artifactual, postoperative or related to infection. Approved by Rogue Bussing on 11/28/16 12:46 PM EDT I have personally reviewed the images and I agree with this report. Report Verified by: Blenda Peals, M.D. at 11-28-16 12:59 PM EDT    X-ray Portable Chest    Result Date: Nov 28, 2016  Exam: XR PORTABLE CHEST Date: 28-Nov-2016 9:37 AM EDT CLINICAL HISTORY: ET Tube placement, TECHNIQUE: 1 view of the chest. COMPARISON: 09/13/2016 FINDINGS: Medical Devices: Endotracheal tube tip projects over the mid trachea. Bilateral chest tubes with the tips projecting over the medial upper lung zones. Heart and Mediastinum: Cardiac size is within normal limits. Calcified atherosclerosis at the aortic knob. Lungs and Pleura: Extensive emphysema with multifocal airspace opacities throughout both lungs and unchanged biapical scarring. Small to moderate right and small left pneumothoraces. Bones and soft tissues: Extensive subcutaneous emphysema. Abdomen: Extensive lucency projecting over the abdomen may relate to pneumoperitoneum in a supine positioned patient.     IMPRESSION: Bilateral pneumothoraces, right greater than left with bilateral chest tubes in place. Endotracheal tube tip projects over the midtrachea. Multifocal airspace disease may be related to a combination of atelectasis, pneumonia and possible contusion if there is a history of trauma. Extensive subcutaneous emphysema. Extensive lucency projecting over the abdomen may related to and pneumoperitoneum in a supine positioned patient. If there is clinical concern, consider dedicated abdominal imaging. CRITICAL RESULT: These findings were discussed with Dr. Maryjean Ka on  November 28, 2016 10:06 AM EDT by telephone.  They confirmed that they understood the findings communicated to them.  #[id number]# Report Verified by: Blenda Peals, M.D. at 11-28-16 10:08 AM EDT    X-ray Pelvis 1 Or 2-views    Result Date: 28-Nov-2016  XR PORTABLE ABDOMEN AP, XR PELVIS 1 OR 2-VIEWS, XR PORTABLE CHEST dated November 28, 2016 11:42 AM EDT HISTORY: Found down, status post CPR, recent intervention for bladder stones/outlet obstruction COMPARISON: Chest radiograph 2 hours prior FINDINGS: Chest: Bilateral large bore chest tubes are again noted. Large right and small left pneumothoraces appear slightly enlarged, although direct comparison is limited due to differences in positioning. Widespread subcutaneous emphysema is again noted. Endotracheal tube tip projects approximately 7.5 cm above the carina. The cardiomediastinal silhouette is within normal limits. Diffuse multifocal airspace opacities are not significantly changed. Abdomen: NG tube tip projects over the gastric body. Several nondilated bowel loops are scattered throughout the abdomen. Lucent interface involving the right hemiabdomen is of uncertain significance, possibly artifactual. Pelvis: A single view is provided for review. A suprapubic catheter is noted. No acute fractures or dislocations are identified. Apparent soft tissue defect involving the scrotum and medial thighs may be artifactual, postoperative or related to infection.     IMPRESSION: Chest: Apparent enlargement of large right and small left pneumothoraces with bilateral chest tubes in place, although direct comparison is limited by differences in projection. Multifocal airspace disease, not significantly changed. Abdomen: Large area of lucency in the right hemiabdomen is of uncertain significance, possibly artifactual. No definite evidence of pneumoperitoneum. Nonobstructive bowel gas pattern. Pelvis: Apparent soft tissue defect involving the scrotum and medial thighs may be artifactual, postoperative or related to  infection. Approved by  Rogue Bussing on 14-Dec-2016 12:46 PM EDT I have personally reviewed the images and I agree with this report. Report Verified by: Blenda Peals, M.D. at 2016/12/14 12:59 PM EDT    X-ray Portable Abdomen Ap View    Result Date: 2016/12/14  XR PORTABLE ABDOMEN AP, XR PELVIS 1 OR 2-VIEWS, XR PORTABLE CHEST dated 2016/12/14 11:42 AM EDT HISTORY: Found down, status post CPR, recent intervention for bladder stones/outlet obstruction COMPARISON: Chest radiograph 2 hours prior FINDINGS: Chest: Bilateral large bore chest tubes are again noted. Large right and small left pneumothoraces appear slightly enlarged, although direct comparison is limited due to differences in positioning. Widespread subcutaneous emphysema is again noted. Endotracheal tube tip projects approximately 7.5 cm above the carina. The cardiomediastinal silhouette is within normal limits. Diffuse multifocal airspace opacities are not significantly changed. Abdomen: NG tube tip projects over the gastric body. Several nondilated bowel loops are scattered throughout the abdomen. Lucent interface involving the right hemiabdomen is of uncertain significance, possibly artifactual. Pelvis: A single view is provided for review. A suprapubic catheter is noted. No acute fractures or dislocations are identified. Apparent soft tissue defect involving the scrotum and medial thighs may be artifactual, postoperative or related to infection.     IMPRESSION: Chest: Apparent enlargement of large right and small left pneumothoraces with bilateral chest tubes in place, although direct comparison is limited by differences in projection. Multifocal airspace disease, not significantly changed. Abdomen: Large area of lucency in the right hemiabdomen is of uncertain significance, possibly artifactual. No definite evidence of pneumoperitoneum. Nonobstructive bowel gas pattern. Pelvis: Apparent soft tissue defect involving the scrotum and medial thighs may be artifactual, postoperative or  related to infection. Approved by Rogue Bussing on 12/14/2016 12:46 PM EDT I have personally reviewed the images and I agree with this report. Report Verified by: Blenda Peals, M.D. at 12-14-16 12:59 PM EDT        MICRO  Infectious Labs  Lab Results   Component Value Date    ISO1 No Growth After 5 Days 09/13/2016   .  No results found for: AEROBOT, ANABOT, LABGRAM, ISO2, ISO3, ISO4, ISO5, ISO6, PNAFISH  No results found for: LABFUNG  No results found for: PROTEINCSF, GLUCCSF, CFLCCOM, CULTCSF  No results found for: HIV1X2  No results found for: CD4TCELL  No results found for: VDRLCSF  No results found for: HAV, HEPAIGM, HEPBIGM, HEPBCAB, HBEAG, HEPCAB   No results found for: HIV1O2AB  No results found for: HIV1O2QUAL      ASSESSMENT and PLAN:    Neuro  #Pain  - Oxycodone 5mg  Q4H  - Tylenol TID  - Fentanyl PRN     Pulmonary  Vent Mode: PC-AC  FiO2:  [59 %-100 %] 66 %  S RR:  [20] 20  S VT:  [400 mL] 400 mL  PEEP/CPAP:  [5 cm H20-10 cm H20] 8 cm H20  #Acute hypoxic hypercapnic respiratory failure  - continue mechanical ventilation with setting as above  - monitor VBG for adjustments to RR/Vt  - not an optimal candidate for ECMO due to baseline lung disease. CT chest without contrast noteable for minimal lung parenchyma with bilateral pneumothorax     #Bilateral Pneumothorax   - continue chest tubes  - mechanical ventilation as listed above   - f/u CXR for improvement/resolution of PTX    #Subcutaneous Emphysema  - suspect worsening distribution with continued ventilation due to underlying lung disease   - manage pain with tylenol, oxy, and fentanyl   -  further goals of care discussion once pt's daughter presents at 2pm     Cardiovascular  #Cardiac Arrest   Most likely 2/2 respiratory failure leading to PEA followed by VF. ROSC achieved with CPR/Defib x4. TTE on 8/7 with probable nl LVEF and trivial pericardial effusion  - wean Levo as able to maintain MAP > 65  - consider repeat TTE s/p cardiac arrest with  rosc    Gastrointestinal  No acute interventions    Renal/GU  No acute interventions    Acid/Base  No acute problems    Infectious Disease  #Leukocytosis   WBC with neutrophil predominance. 21 --> 18.4. Initially presented to OSH with temp of 97.6.    - Urinalysis: neg nitrite, moderate leuk, 63 WBC, few bacteria; reflex to culture  - BC on 8/19 NGTD  - continue to follow CBC for WBC with no indications for ABX at this time    Endocrine  No acute interventions     Heme/Onc  #Leukocytosis   WBC with neutrophil predominance. 21 --> 18.4. Initially presented to OSH with temp of 97.6.    - Urinalysis: neg nitrite, moderate leuk, 63 WBC, few bacteria; reflex to culture  - BC on 8/19 NGTD    #Normocytic Anemia  Hgb 13.6 --> 11.7   - Continue to monitor for bleeding     Psych  No acute interventions    Fluid/Electrolytes/Nutrition  IVF: NONE  Electrolytes: replete prn  Nutrition:   Diet Orders          None        Prophylaxis  DVT Prophylaxis: SQH  GI Prophylaxis: Protonix 40 IV daily     Reanimation   PT/OT ordered    Social work   Will make SW aware of patient's situation    Code Status: Full Code    Information will be relayed to family and patient.  Thank you for taking the time to read this note.    Signed:  Pricilla Larsson, MD  PGY-2 Internal Medicine  11/01/2016, 5:05 PM

## 2016-11-14 NOTE — Unmapped (Signed)
[name]    Date of Service: 2016/11/20    Reason for Visit: Shortness of Breath and Altered Mental Status      Patient History     HPI:  66 y.o. male with a pmhx of  Benign prostatic hyperplasia, bladder stones,  Stage 4 COPD, Hypoxia requiring supplemental oxygen (2 L requirement, stage 4 COPD per pulm notes),  And tobacco abuse who presented to the ED via EMS for shortness of breath. At time of presentation the patient is not responsive to questions, sternal rub, or pinching. His pupils are equal. EMS reports that he was equally unresponsive en route to the ED with an oxygen saturation of 81%. EMS report that the patient lives with his daughter and sent her a text reading help around 4:30 this morning. The daughter did not find the patient until around 7:30. When she found the patient, the patient was down and unresponsive prompting her to immediatly call for an ambulance. The patient was  administered Nebulizers during transport by EMS including albuterol. During this transportation an irregular EKG was also noted (tachycardic, but not STEMI).    The patient was last admitted here 9 days ago for a bladder outlet obstruction.  At this time the patient underwent a cystoscopy, suprapubic tube placement, cystolitholapaxy, transurethral resection of prostate,  And bladder biopsy. He was discharged home 6 days ago in good condition.    ROS: Unable to obtain due to altered mental status, attempted to get a hold of family, but was unable    Medical Hx        Past Medical History:   Diagnosis Date   ??? Anxiety    ??? Benign prostatic hyperplasia    ??? Bladder stones    ??? 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 History of alcohol abuse    ??? Hypoxemia requiring supplemental oxygen    ??? Tobacco abuse        Past Surgical History:   Procedure Laterality Date   ??? COLONOSCOPY W/ BIOPSIES AND POLYPECTOMY     ??? TRANSURETHRAL RESECTION OF PROSTATE N/A 11/05/2016    Procedure:  transurethral resection of the prostate (TURP)with Cystolitholapaxy, SUPRAPUBIC CATHETER;  Surgeon: Gillis Ends;  Location: [facility] OR;  Service: Urology;  Laterality: N/A;       Yordi Krager  reports that he quit smoking about 10 months ago. His smoking use included Cigarettes and Cigars. He has a 67.50 pack-year smoking history. He has never used smokeless tobacco. He reports that he has current or past drug history, including Marijuana. He reports that he does not drink alcohol.    Previous Medications    ALBUTEROL (PROVENTIL) 2.5 MG /3 ML (0.083 %) NEBULIZER SOLUTION    Inhale 2.5 mg by nebulization every 6 hours as needed.              ALBUTEROL (PROVENTIL;VENTOLIN;PROAIR) 90 MCG/ACTUATION INHALER    Inhale 2 puffs into the lungs every 4 hours as needed for Wheezing.    ALPRAZOLAM (XANAX) 1 MG TABLET    Take 1 mg by mouth 2 times a day.              ALPRAZOLAM (XANAX) 1 MG TABLET    Take 2 mg by mouth after lunch. Between 1400-1500    DIPHENHYDRAMINE (BENADRYL) 25 MG CAPSULE    Take 25-50 mg by mouth if needed.  FLUTICASONE-SALMETEROL (ADVAIR) 250-50 MCG/DOSE DISKUS INHALER    Inhale 2 puffs into the lungs 2 times a day.              FOOD SUPPLEMT, LACTOSE-REDUCED (ENSURE ORIGINAL) LIQD    Take 1 Can by mouth 3 times a day. vanilla    IBUPROFEN (ADVIL) 200 MG TABLET    Take 400 mg by mouth if needed.              OXYCODONE (ROXICODONE) 5 MG IMMEDIATE RELEASE TABLET    Take 1 tablet (5 mg total) by mouth every 6 hours as needed for up to 7 days.    SENNA-DOCUSATE (SENNA-S) 8.6-50 MG PER TABLET    Take 1 tablet by mouth 2 times a day.    TAMSULOSIN (FLOMAX) 0.4 MG CAP    TAKE ONE CAPSULE BY MOUTH ONCE DAILY AT BEDTIME    TIOTROPIUM (SPIRIVA) 18 MCG    Inhale 1 capsule (18 mcg total) into the lungs daily.       Allergies:   Allergies as of 11/10/2016   ??? (No Known Allergies)         Physical Exam     Initial vitals with HR 110, BP 115/60, RR 30, SpO2 95% on NRB, afebrile    General: This is a  chronically ill-appearing and cachectic gentleman who appears to be in some respiratory distress, unresponsive     HEENT: NC/AT. PERRL - small, 2 mm, sluggish response. OP clear. MMM.     Neck: Supple. Trachea midline. No lymphadenopathy.     Pulmonary: labored breathing, tachypnea, retractions noted, no chest wall TTP, lungs with minimal air movement throughout, no wheezing appreciated     Cardiac: tachycardic but RR -m/g/r.     Abdomen: S/NT/ND/+BS. No cva tenderness. Suprapubic catheter noted, site unremarkable    Musculoskeletal: WWP with no clubbing, cyanosis, or deformities noted. Baseline ROM, no TTP. No edema.     Vascular: +2 radial and DP pulses.     Skin: Warm and dry with no lesions noted - no petechiae, no purpura, no rashes. Non icteric.    Neuro:  Unresponsive to sternal rub, face is grossly symmetric, not moving extremities at all     Diagnostic Studies     Labs:   Please see electronic medical records for full details.     Radiology:   Please see electronic medical records for full details.      EKG: (pre hospital EKG review sent by EMS)  Indication: Shortness of breath and altered mental status.  Heart rate 120, sinus rhythm, slight ST depressions noted diffusely but no obvious ST elevation or T-wave inversions.  No prior EKG on file for comparison.  Impression: Sinus tachycardia with possible early ischemia.    Emergency Department Procedures     See resident note for details - I was present for key portions of all procedures  Intubation  Bilateral chest tubes  Intra-arrest a line    ED Course and MDM     Derrick Garner is a 66 y.o. male who presented to the emergency department with Shortness of Breath and Altered Mental Status  . The patient was evaluated by myself and the ED Attending Physician.     Patient was placed in our resuscitation bay given he looked very unwell on arrival.  Initial exam revealed a chronically ill-appearing male who appeared to be in some respiratory distress with almost  no air movement bilaterally, and significantly altered mental status out of  proportion to his respiratory status.  Labs were drawn, and patient was placed on BiPAP.  I did watch him for approximately 5-10 minutes while he was on BiPAP, and ultimately opted to intubate the patient given his mental status and my concern for other pathology such as head bleed that would be causing his altered mental status.  VBG at this time had not returned.  I did attempt also to contact family given his stage IV COPD to make sure that he was full code, and he is noted to be full code as of 8 days ago when he was here for surgery, but I was unable to get hold of family and therefore proceed with intubation.  Given low BP prior to intubation, 1 L NS started bolus. Nebs through BiPap (15/5 settings). Please see resident note for details, but intubation was completed without difficulty although the patient was noted to have a thready pulse after intubation with a low blood pressure in the 90s, and while nursing was attempting to draw push dose epinephrine, I did give him half an amp of epinephrine code dose, however 1 minute later it was noted the patient lost pulses regardless.  CPR was initiated and ACLS was followed per protocol.    Patient was given another dose of epinephrine and on 1st pulse check noted to be in V. fib, shocked was administered at 200 J and 300 mg of amiodarone was given.  Next pulse check revealed continued V. fib, patient shocked again.  Next pulse check revealed PEA, and patient was given calcium and bicarbonate.  At this time ABG returned with a pH of 7.1, CO2 of 124, therefore hyperventilation was initiated by respiratory therapy through BVM while CPR continued. Glucose noted to be normal at 120 on labs. Patient given another epinephrine, and continued to be in PEA on next pulse check, but was noted to have increased resistance on bagging per respiratory.  He did appear to have some crepitus of his chest, and  bilateral decompressions of lungs were initiated with significant air output.  Patient likely had bilateral spontaneous pneumothoraces due to stage IV COPD and intubation.    On next pulse check, patient was noted to have return of spontaneous circulation.  Epinephrine drip initiated, but patient remained hypotensive and Levophed drip initiated as well.  Hyperventilation continued by respiratory.  Bilateral formal chest tubes placed by resident under sterile conditions.  Patient given Ancef given initial chest decompression was performed under semi-sterile conditions.    Of note, patient did have an intra-arrest arterial line complication including significant groin/scrotal hematoma despite line being placed under ultrasound guidance.  Pressure was applied throughout full 25 minutes of ACLS.  Scrotal swelling did appear to decreased some after pressure was continued on right groin, and I did speak with vascular surgery who recommended patient transferred down to UC for intervention of this specific complication.    At this time patient was accepted by MICU done at [facility].    Repeat VBG with improving CO2.    Chest x-ray with bilateral pneumothoraces but chest tube and endotracheal tube in good position.    Patient remained stable with epinephrine and Levophed drips and was transferred by air care to MICU at [facility].     Repeat EKG post arrest did not reveal STEMI.    Patient was treated with below medications.    Medications   ipratropium-albuterol (DUO-NEB) 0.5 mg-3 mg(2.5 mg base)/3 mL nebulizer solution (not administered)   rocuronium (ZEMURON) 10  mg/mL injection (not administered)   etomidate (AMIDATE) 2 mg/mL injection (not administered)   ipratropium-albuterol (DUO-NEB) 0.5 mg-3 mg(2.5 mg base)/3 mL nebulizer solution 3 mL (not administered)   fentaNYL (SUBLIMAZE) 50 mcg/mL injection (not administered)   sodium chloride 0.9 % infusion (not administered)   ceFAZolin (ANCEF) IVPB 2 g in D5W (duplex)  (not administered)   sodium bicarbonate 8.4 % (1 mEq/mL) injection (50 mEq Intravenous Given 11-27-2016 0914)   calcium chloride 100 mg/mL (10 %) injection (1 g Intravenous Given 11-27-2016 0913)   EPINEPHrine (ADRENALIN) 0.1 mg/mL injection Syrg (1 mg Intravenous Given 11-27-16 0919)   amiodarone (CORDARONE) injection (300 mg Intravenous Given 2016-11-27 0908)   EPINEPHrine (ADRENALIN) 10 mg in sodium chloride 0.9% 250 mL infusion (10 mcg/min Intravenous New Bag 11/27/16 0920)   norepinephrine (LEVOPHED) 0.064 mg/mL in sodium chloride 0.9 % 250 mL infusion (5 mcg/min Intravenous New Bag 11-27-2016 0932)   fentaNYL (SUBLIMAZE) injection (100 mcg Intravenous Given November 27, 2016 0900)   ceFAZolin (ANCEF) in D5W 50 mL 2 gram/50 mL (2 g Intravenous New Bag November 27, 2016 0925)       Consults:  Vascular  micu    Impression     COPD exacerbation  Cardiac arrest  Bilateral pneumothoraces  Groin hematoma    Plan     At this time the patient has been admitted And will be transferred to [facility] for further evaluation and management. The patient will continue to be monitored here in the Emergency Department until which time he is moved to his new treatment location.       By signing my name below, I, Meredith Staggers, attest that this documentation has been prepared under the direction and in the presence of Allena Napoleon, MD  Scribe name: Meredith Staggers  Date: 11/27/2016      ----  Attending Physician Attestation  The scribe???s documentation has been prepared under my direction and personally reviewed by me in its entirety. I confirm that the note above accurately reflects all work, treatment, procedures, and medical decision making performed by me.   Allena Napoleon, MD  Attending Physician Emergency Medicine  2016-11-27 10:56 AM        Allena Napoleon, MD  [facility]  Emergency Department       Hazle Quant, MD  Nov 27, 2016 [phone]

## 2016-11-14 NOTE — Unmapped (Signed)
300mg  Amiodarone administered IVP.  1mg  Epinephrine administered IVP.

## 2016-11-14 NOTE — Progress Notes (Signed)
MICU ATTENDING PROGRESS NOTE    This patient was seen by the MICU resident team, nurses, pharmacist, and respiratory therapist.  I have personally seen, examined, and discussed this patient with the critical care team on 11-23-16.  My assessment is noted below, and the multidisciplinary recommendations have been discussed with the MICU team.     My assessment reveals:  Pt is a 66 yo with severe COPD with FEV 1 <20%    He presented to [facility] after feeling sob and having mental status changes     Upon intubation he arrested    PEA then ?V fib-- was shocked 4 times and had prolong arrest time of 20 min    With bil ct placement for PTX, and then aborted aline    Pt arrived with extensive subcutaneous eympsema       Appears that aline attempt was above the inguinal ligament and that there is no vascular injury     Ct on r likely in parenchyma     Ventilation is not ideal    Will follow up with ct scan    family meeting     Pt daughter POA    Siblings at bedside     Will need access and aline    Consent pending     Ongoing resuscitation       RESPIRATORY:  Acute on chronic respiratory failure  Atelectasis/pulmonary collapse  COPD  Hypoxia  Respiratory acidosis  Respiratory arrest  Respiratory failure, acute WITHOUT trauma or surgery   Vent Mode: PC-AC  FiO2:  [59 %-100 %] 71 %  S RR:  [20] 20  S VT:  [400 mL] 400 mL  PEEP/CPAP:  [5 cm H20-10 cm H20] 5 cm H20       CARDIOVASCULAR:  Acidosis  Arrhythmia, unspecified  Cardiac arrest   Shock, Unspecified     NUTRITION:  Malnutrition, moderate (albumin 2.4-3.0)     GASTROINTESTINAL:  Dysphagia     FLUIDS / ELECTROLYTES:  Hypokalemia  Hypomagnesemia     RENAL:  Acute kidney injury     HEMATOLOGIC:   Anemia, acute     ENDOCRINE:   Hyperglycemia      INFECTIOUS DISEASE:   Leukocytosis     NEUROLOGIC:  Altered Mental Status   Encephalopathy     PSYCHIATRIC, PAIN, SEDATION:  Pain Management      INJURY / DISEASE SPECIFIC NEEDS:    GI Prophylaxis: Yes  DVT Prophylaxis: Yes    ACTIVE  LINES;   Patient Lines/Drains/Airways Status    Active Epidural Line / PICC Line / PIV Line / ART Line / Line / CVC Line     Name:   Placement date:   Placement time:   Site:   Days:    Peripheral IV 23-Nov-2016 Right Antecubital  Nov 23, 2016    0846    Antecubital    less than 1    Peripheral IV 23-Nov-2016 Left   Nov 23, 2016    1134        less than 1    Peripheral IV Nov 23, 2016 Right   November 23, 2016    1235        less than 1                  DISPOSITION:   Remain in ICU    Total critical care time spent caring for this patient over the past 24 hours: 75 minutes    Peggi Yono S HEBBELER-CLARK  Nov 23, 2016  3:33 PM

## 2016-11-14 NOTE — Unmapped (Signed)
Recommended by the previous shift's chaplain, I visited Derrick Garner and his sister who was at bedside.  I provided pastoral support and offered prayer with them.  Follow-up is recommended, as [name]'s daughter from [place] will be arriving on Monday.  Chaplain Derrick Garner, D.Min.

## 2016-11-14 NOTE — Unmapped (Signed)
[facility] ED Procedure Note        Emergency Department Procedures     Chest Tube  Date/Time: 10/29/2016 10:25 AM  Performed by: Lenore Manner  Authorized by: Hazle Quant   Consent: The procedure was performed in an emergent situation.  Patient identity confirmed: provided demographic data  Time out: Immediately prior to procedure a time out was called to verify the correct patient, procedure, equipment, support staff and site/side marked as required.  Indications: pneumothorax  Preparation: skin prepped with Betadine  Placement location: right lateral  Scalpel size: 10  Tube size: 24 [place]  Dissection instrument: finger  Tube connected to: suction  Suture material: 2-0 silk  Dressing: 4x4 sterile gauze  Post-insertion x-ray findings: tube in good position  Patient tolerance: Patient tolerated the procedure well with no immediate complications  Complications: none and other (detail in comments)  Comments: Intra-arrest finger thoracostomy with chest tube placement upon ROSC, sterility compromised          Lenore Manner  Emergency Medicine PGY-3

## 2016-11-14 NOTE — ED Notes (Signed)
RSI medications administered by Wille Celeste. RN

## 2016-11-15 LAB — RENAL FUNCTION PANEL W/EGFR
Albumin: 2.7 g/dL (ref 3.5–5.7)
Albumin: 2.6 g/dL — ABNORMAL LOW (ref 3.5–5.7)
Anion Gap: 11 mmol/L (ref 3–16)
Anion Gap: 8 mmol/L (ref 3–16)
BUN: 19 mg/dL (ref 7–25)
BUN: 22 mg/dL (ref 7–25)
CO2: 27 mmol/L (ref 21–33)
CO2: 31 mmol/L (ref 21–33)
Calcium: 8.8 mg/dL (ref 8.6–10.3)
Calcium: 9 mg/dL (ref 8.6–10.3)
Chloride: 103 mmol/L (ref 98–110)
Chloride: 102 mmol/L (ref 98–110)
Creatinine: 1.27 mg/dL (ref 0.60–1.30)
Creatinine: 1.45 mg/dL — ABNORMAL HIGH (ref 0.60–1.30)
Glucose: 101 mg/dL (ref 70–100)
Glucose: 104 mg/dL — ABNORMAL HIGH (ref 70–100)
Osmolality, Calculated: 294 mOsm/kg (ref 278–305)
Osmolality, Calculated: 296 mosm/kg (ref 278–305)
Phosphorus: 1.1 mg/dL (ref 2.1–4.5)
Phosphorus: 1.5 mg/dL — ABNORMAL LOW (ref 2.1–4.5)
Potassium: 5.5 mmol/L (ref 3.5–5.3)
Potassium: 6 mmol/L — ABNORMAL HIGH (ref 3.5–5.3)
Sodium: 141 mmol/L (ref 133–146)
Sodium: 141 mmol/L (ref 133–146)
eGFR AA CKD-EPI: 68 See note.
eGFR AA CKD-EPI: 58 See note.
eGFR NONAA CKD-EPI: 58 See note.
eGFR NONAA CKD-EPI: 50 See note.

## 2016-11-15 LAB — CBC
Hematocrit: 35.7 % (ref 38.5–50.0)
Hemoglobin: 12.1 g/dL (ref 13.2–17.1)
MCH: 30.1 pg (ref 27.0–33.0)
MCHC: 34 g/dL (ref 32.0–36.0)
MCV: 88.6 fL (ref 80.0–100.0)
MPV: 7.2 fL (ref 7.5–11.5)
Platelets: 372 10*3/uL (ref 140–400)
RBC: 4.03 10*6/uL (ref 4.20–5.80)
RDW: 14.3 % (ref 11.0–15.0)
WBC: 21.2 10*3/uL (ref 3.8–10.8)

## 2016-11-15 LAB — MAGNESIUM: Magnesium: 1.7 mg/dL (ref 1.5–2.5)

## 2016-11-15 LAB — HEPATIC FUNCTION PANEL
ALT: 39 U/L (ref 7–52)
AST: 36 U/L (ref 13–39)
Albumin: 2.7 g/dL (ref 3.5–5.7)
Alkaline Phosphatase: 43 U/L (ref 36–125)
Bilirubin, Direct: 0.04 mg/dL (ref 0.00–0.40)
Bilirubin, Indirect: 0.46 mg/dL (ref 0.00–1.10)
Total Bilirubin: 0.5 mg/dL (ref 0.0–1.5)
Total Protein: 5.4 g/dL (ref 6.4–8.9)

## 2016-11-15 LAB — VENOUS BLOOD GAS, LINE/SYRINGE
%HBO2-Line Draw: 40.9 % (ref 40.0–70.0)
Base Excess-Line Draw: 3.2 mmol/L (ref <=3.0)
CO2 Content-Line Draw: 31 mmol/L (ref 25–29)
Carboxyhgb-Line Draw: 1.4 %
HCO3-Line Draw: 30 mmol/L (ref 24–28)
Methemoglobin-Line Draw: 0.8 % (ref 0.0–1.5)
PCO2-Line Draw: 51 mm Hg (ref 41–51)
PH-Line Draw: 7.37 (ref 7.32–7.42)
PO2-Line Draw: 25 mm Hg (ref 25–40)
Reduced Hemoglobin-Line Draw: 56.9 % (ref 0.0–5.0)

## 2016-11-15 LAB — LACTIC ACID: Lactate: 3.3 mmol/L (ref 0.5–2.2)

## 2016-11-15 MED ORDER — oxyCODONE (ROXICODONE) immediate release tablet 10 mg
5 | ORAL | Status: AC
Start: 2016-11-15 — End: 2016-11-17
  Administered 2016-11-16 (×6): 10 mg via ORAL

## 2016-11-15 MED ORDER — electrolyte-R (pH 7.4) (NORMOSOL-R pH 7.4) 1,000 mL bolus
Freq: Once | INTRAVENOUS | Status: AC
Start: 2016-11-15 — End: 2016-11-15
  Administered 2016-11-15: 05:00:00 via INTRAVENOUS

## 2016-11-15 MED ORDER — fentaNYL (SUBLIMAZE) injection 50 mcg
50 | Freq: Once | INTRAMUSCULAR | Status: AC
Start: 2016-11-15 — End: 2016-11-15
  Administered 2016-11-15: 50 ug via INTRAVENOUS

## 2016-11-15 MED ORDER — carboxymethylcellulose (REFRESH PLUS) 0.5 % ophthalmic solution 2 drop
0.5 | Freq: Four times a day (QID) | OPHTHALMIC | Status: AC | PRN
Start: 2016-11-15 — End: 2016-11-17
  Administered 2016-11-15 – 2016-11-16 (×3): 2 [drp] via OPHTHALMIC

## 2016-11-15 MED ORDER — oxyCODONE (ROXICODONE) immediate release tablet 5 mg
5 | Freq: Once | ORAL | Status: AC
Start: 2016-11-15 — End: 2016-11-17

## 2016-11-15 MED ORDER — oxyCODONE (ROXICODONE) 5 MG immediate release tablet
5 | ORAL | Status: AC
Start: 2016-11-15 — End: 2016-11-15
  Administered 2016-11-15: 12:00:00 10

## 2016-11-15 MED ORDER — fentaNYL (SUBLIMAZE) injection 25 mcg
50 | INTRAMUSCULAR | Status: AC | PRN
Start: 2016-11-15 — End: 2016-11-16

## 2016-11-15 MED ORDER — fentaNYL (SUBLIMAZE) injection 25 mcg
50 | Freq: Once | INTRAMUSCULAR | Status: AC
Start: 2016-11-15 — End: 2016-11-16

## 2016-11-15 MED ORDER — fentaNYL (SUBLIMAZE) 50 mcg/mL injection
50 | INTRAMUSCULAR | Status: AC
Start: 2016-11-15 — End: 2016-11-15

## 2016-11-15 MED ORDER — fentaNYL (SUBLIMAZE) injection 50 mcg
50 | INTRAMUSCULAR | Status: AC | PRN
Start: 2016-11-15 — End: 2016-11-16
  Administered 2016-11-15 – 2016-11-16 (×4): 50 ug via INTRAVENOUS

## 2016-11-15 MED ORDER — oxyCODONE (ROXICODONE) immediate release tablet 10 mg
5 | Freq: Four times a day (QID) | ORAL | Status: AC
Start: 2016-11-15 — End: 2016-11-15
  Administered 2016-11-15 (×2): 10 mg via ORAL

## 2016-11-15 MED FILL — HEPARIN (PORCINE) 5,000 UNIT/ML INJECTION SOLUTION: 5000 5,000 unit/mL | INTRAMUSCULAR | Qty: 1

## 2016-11-15 MED FILL — PROTONIX 40 MG INTRAVENOUS SOLUTION: 40 40 mg | INTRAVENOUS | Qty: 40

## 2016-11-15 MED FILL — REFRESH PLUS 0.5 % EYE DROPS IN A DROPPERETTE: 0.5 0.5 % | OPHTHALMIC | Qty: 1

## 2016-11-15 MED FILL — SENNOSIDES 8.6 MG-DOCUSATE SODIUM 50 MG TABLET: 8.6-50 8.6-50 mg | ORAL | Qty: 1

## 2016-11-15 MED FILL — FENTANYL (PF) 50 MCG/ML INJECTION SOLUTION: 50 50 mcg/mL | INTRAMUSCULAR | Qty: 2

## 2016-11-15 MED FILL — OXYCODONE 5 MG TABLET: 5 5 MG | ORAL | Qty: 2

## 2016-11-15 MED FILL — TYLENOL 325 MG TABLET: 325 325 mg | ORAL | Qty: 2

## 2016-11-15 MED FILL — OXYCODONE 5 MG TABLET: 5 5 MG | ORAL | Qty: 1

## 2016-11-15 NOTE — Plan of Care (Signed)
Problem: Potential for Compromised Skin Integrity  Goal: Skin integrity is maintained or improved  Assess and monitor skin integrity. Identify patients at risk for skin breakdown on admission and per policy. Collaborate with interdisciplinary team and initiate plans and interventions as needed.     Intervention: Turn patient  Pt has a Braden score of 14 and is at risk of skin compromise. Pt turned q2 hrs to relieve pressure off bony prominences. Pillow support used to help maintain patient placement. Heels elevated off bed. Continence assessed to avoid skin irritation. Skin kept clean and dry.Patient on low air loss bed. Will continue to monitor

## 2016-11-15 NOTE — Unmapped (Signed)
[facility]   Social Work Psychosocial Assessment     Derrick Garner  [id number]  66 y.o.  male  White or Caucasian  Marital Status: Divorced    ACUTE HYPOXEMIC RESPIRATORY FAILURE, CARDIAC ARREST    Referred by: BS RN  Referred Reason: Withdraw of care      History    Past Medical History:   Diagnosis Date   ??? Anxiety    ??? Benign prostatic hyperplasia    ??? Bladder stones    ??? 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 History of alcohol abuse    ??? Hypoxemia requiring supplemental oxygen    ??? Tobacco abuse        History   Drug use: Unknown   ??? Types: Marijuana     Comment: Hx of marijuana use not for last 85yrs       History   Alcohol Use No     Comment: quit 81yrs ago, hx of heavy alcohol abuse prior       Mental Health History: anxiety     Mental Status    Current Mental Status: Awake, Oriented to Person, Oriented to Place, Oriented to Time, Oriented to Situation, Intubated  Mental Status Prior to Admission: Unable to Assess  Activities of Daily Living: Partial Assistance Needed       Current Living Arrangements    Current Living Arrangements: With Family  Type of Living Arrangement: Home/Apartment  Living Arrangements Comments: Pt resides with his brother                Support Systems      Next of Kin/Contact Person: Derrick Garner   Next of Kin Relationship:  Daughter  Next of Kin Phone Number: [phone]            [employer] Used Prior to Admission: Unknown             Cultural/Spiritual/Language Barriers     none     Other Pertinent Data                                Case Production designer, theatre/television/film for Mental Health IssuesPrior to Admission: Unknown          Durable Medical Equipment Prior to Admission: Unknown             Name/number of PCP: Radford Pax        Assessment/Plan    Reviewed chart.  Pt is a 66 year old DWM with a history of COPD Stage 4 (baseline oxygen 3-4 and FEV1 20% in 2017), BPH s/p TURP c/b recurrent UTIs, several bladder stones, and bladder mucosa  suspicious for bladder cancer who was transferred from [facility] ED for further management following cardiac arrest.  Pt AO x 4 and intubated in the MICU.    BS RN reports pt plans for terminal extubation after his daughter arrives from [place] later this afternoon.  Pt is divorced, has one daughter that lives in [place], and he resides with his brother.  He is currently able to make his own medical decisions.       I met with the pt and his sister, Derrick Garner, at [employer] and explained role. The pt made it very clear that he wants the tube to come out after he has time with his daughter.  He does not want to be reintubated and understands that he will die  if he goes into respiratory distress.       He asked if he could have water once the tube came out.  I made him aware of the high chance that he could aspirate but that we would honor his wishes should he want water.  He then stated he would like some ice.      BS RN made aware.      SW will follow.       This assessment has been reviewed with the multi-disciplinary team.

## 2016-11-15 NOTE — Unmapped (Signed)
Pt decided against the body donation program.  I provided his sister with  A list of crematories and made them aware of [facility]'s role.

## 2016-11-15 NOTE — Unmapped (Signed)
Problem: Non-violent, non-self-destructive restraints  Less restrictive alternative interventions will be considered prior to application of restraint devices.   Goal: Patient will be restrained only to maintain safety  Alternatives to restraints can include; moving the patient closer to the nurses station, video monitoring where available, medicating for pain, agitation or psychosis, psychosocial interventions, manipulation of environment, frequent toileting, diversional activities, bed alarms, having family members sit with patient, frequent reorientation, or repositioning.     Intervention: Attempt/consider less restrictive devices to maintain safety of patient.  Bilateral soft wrist restraints removed this AM. Derrick Garner has remained calm and cooperative. He understands importance of ET tube remaining in place and is compliant. No complications. Sister at the bedside. Will continue to monitor.

## 2016-11-15 NOTE — Unmapped (Signed)
I made a follow-up visit with Derrick Garner and a number of family members, including his daughter [name] who had just arrived from [place].  I provided pastoral support and offered prayer with these persons.  Follow-up visitation is recommended.  Chaplain Gaetana Michaelis, D.Min.

## 2016-11-15 NOTE — Unmapped (Signed)
Recommended by the previous shift's chaplain, I visited Derrick Garner and a number of family members, including his mother.  I provided pastoral support and offered prayer with them, as Lando awaited the arrival of his daughter from [place].  Follow-up is recommended.  Chaplain Gaetana Michaelis, D.Min.

## 2016-11-15 NOTE — Unmapped (Signed)
Responded to referral from chaplain.  Pt's daughter will arrive from [place] this afternoon.  Pt will then be extubated and withdraw care.  Provided spiritual care through pastoral presence and prayer.    No additional needs stated at this time.    Peri Jefferson, MDiv, BCCC,BCPC

## 2016-11-15 NOTE — Progress Notes (Signed)
Occupational/Physical Therapy- discharge   Reason Patient Not Seen- sign off     Name: Derrick Garner  DOB: 1950-08-25  Attending Physician: Desma Mcgregor, *  Admission Diagnosis: ACUTE HYPOXEMIC RESPIRATORY FAILURE, CARDIAC ARREST  Date: 11/15/2016  Precautions:    Reviewed Pertinent hospital course: No    Unable to see patient due to: Per team, discussion this AM confirms withdrawl of care; PT/OT consult received and acknowledged; per team- pt with plans to w/d cre, thus no skilled PT/OT needs at this time- will sign off.  No Charge.     Alanda Slim, OTR/L  Pager: [phone]  Office: [phone]  Shift: 8-4:30, M-F

## 2016-11-15 NOTE — Unmapped (Signed)
Derrick Garner now has open visiting hours. He has his sister, brother-in-law, and nephew at the bedside. Brother and sister-in-law expected soon. Daughter is flying in from [place]- her flight is due to land in [place] at 1440 today. Plan is to extubate and withdraw care once his daughter gets here and is able to see him. Chaplain is aware and has seen the family.

## 2016-11-15 NOTE — Unmapped (Signed)
MICU ATTENDING DAILY PROGRESS NOTE    I independently saw and examined this patient.  The x-rays and labs were reviewed.  The case was discussed in detail during multidisciplinary rounds, the events of the past 24 hours reviewed, and a plan for medical care arranged.     Plan for Today  Comfort care only  Once he has seen his daughter, will begin Morphine infusion titrated to pain, dyspnea and tachypnea (goal rate < 30 bpm)    This is a 66 yo male that presented on December 10, 2016 in transfer form [facility] with hypoxic respiratory failure and a cardiac arrest which occurred following intubation.   Has a known hx of known very severe COPD (FEV1 of 700cc or 20% predicted on 02/2016).     BP 92/61    Pulse 103    Temp 98.6 ??F (37 ??C) (Oral)    Resp 25    Ht 5' 10 (1.778 m)    Wt 114 lb 13.8 oz (52.1 kg)    SpO2 90%    BMI 16.48 kg/m??     HEENT - PERRL, conjugate gaze  CV - RRR   Norepi is off  Chest/Lungs - bilateral breath sounds are present but with very diminished air entry.   Vent settings:  PCV  PCVpress 27 cm H2O / Rate 20 bpm / FiO2 40% / PEEP +8  Abd - soft without guarding  Ext - no edema  Neuro - awake, alert and very appropirate    Assessment and Plan  --s/p Cardiac Arrest  --Acute on Chronic Respiratory Failure secondary to an exacerbation of Very Severe COPD  --Bilateral Pneumothorax  --Acute Kidney Injury  --Severe BPH s/p TURP approx 1 weeks ago    Mr. Fendley was very awake and alert on vent.  Was alert and oriented.  He then expressed very clearly via a letter board that he wanted to be made comfortable and have the current supportive measures withdrawn.  Asked that we wait to do so however until he has had a chance to see his daughter.   His family was present for this interaction.     ICU Checklist  DVT Prophylaxis: SQ UFH  GI Ulcer Prophylaxis: PPI  Nutrition: NPO  Invasive Line:   Patient Lines/Drains/Airways Status    Active Line / PIV Line     Name:   Placement date:   Placement time:   Site:   Days:     Peripheral IV 12/10/16 Right Antecubital  12-10-16    0846    Antecubital    1    Peripheral IV 12/10/2016 Left   10-Dec-2016    1134        1    Peripheral IV 2016-12-10 Right   2016-12-10    1235        1              Mobility: Bedrest  Foley with Order for Insertion & Maintenance: Present  Lab Orders: Reviewed  Code Status: Full Code  Family: Updated at bedsde and Present for rounds    I spent a total of 32 minutes of critical care time caring for this patient with respiratory failure and severe COPD, including direct patient contact, management of life support systems review of data (i.e.: imaging and lab), discussion with team members, and this time excludes time spent on procedures    [employer], DO  1:44 PM, 11/15/2016

## 2016-11-15 NOTE — Progress Notes (Signed)
Department of Internal Medicine  MICU Progress Note    Derrick Garner  [id number]  11/15/2016  4:35 PM  MI15/UMICU-15    CODE  Full Code    Chief Complaint / Subjective     [name] is a 66 y.o. male with history of  COPD Stage 4 (baseline oxygen 3-4 and FEV1 20% in 2017), BPH s/p TURP c/b recurrent UTIs, several bladder stones, and bladder mucosa suspicious for bladder cancer who was transferred from [facility] ED for further management following cardiac arrest.      Significant events over the past 24 hours include:   -- Patient has decided to switch status to comfort care once daughter is present    DAILY PLAN   -- Waiting on patient to give order for status change. He is currently alert and oriented and able to make medical decisions .   --Once decided we can provide support with morphine drips as needed to maintain RR <30. If able OK to place patient in PSV to help titrate morphine drip.     CONSULTS  -- None    DAILY LABS  -- None    Code Status: Full Code  GI Prophylaxis: PPI  DVT Prophylaxis: SQH    Pressors: None  Drips: None  Sedation: PRN Oxy  Continuous Infusions:   norepinephrine infusion Stopped (11/15/16 0521)       Lines:     Vent Mode: PC-AC  FiO2:  [30 %-100 %] 40 %  S RR:  [20] 20  PEEP/CPAP:  [8 cm H20] 8 cm H20    Access/Lines/Tubes/Drains     PIV  Chest tubes R and L  No central line  ETT  NG/OG    Mentation     CAM: Overall CAM-ICU : No Delirium  RASS: [place] Agitation Sedation Scale: 0    Continuous Fluids   Continuous Infusions:   norepinephrine infusion Stopped (11/15/16 0521)         Intake / Output       Intake/Output Summary (Last 24 hours) at 11/15/16 1635  Last data filed at 11/15/16 1218   Gross per 24 hour   Intake           3190.2 ml   Output              580 ml   Net           2610.2 ml       Foley removal: None  Restraints: None    Vent Settings   Vent Mode: PC-AC  FiO2:  [30 %-100 %] 40 %  S RR:  [20] 20  PEEP/CPAP:  [8 cm H20] 8 cm H20    Lactate:   Recent Labs       12-08-16   0830  12-08-2016   1211  11/15/16   0347   LACTATE  0.7  7.1*  3.3*       ABG: Recent Labs      Dec 08, 2016   0830   PCO2  123*         Physical Exam   BP  Min: 48/35  Max: 160/65  No Data Recorded  Temp  Avg: 98.4 F (36.9 C)  Min: 98.1 F (36.7 C)  Max: 98.6 F (37 C)  Pulse  Avg: 111.2  Min: 101  Max: 131  Resp  Avg: 20.9  Min: 16  Max: 25  SpO2  Avg: 92.4 %  Min: 66 %  Max: 100 %  BP  Min: 48/35  Max: 160/65  MAP (mmHg)  Avg: 68.3  Min: 38  Max: 89  MAP (mmHg)  Avg: 68.3  Min: 38  Max: 89    Intake/Output Summary (Last 24 hours) at 11/15/16 1635  Last data filed at 11/15/16 1218   Gross per 24 hour   Intake           3190.2 ml   Output              580 ml   Net           2610.2 ml     I/O last 3 completed shifts:  In: 3070.2 [I.V.:3010.2; NG/GT:60]  Out: 925 [Urine:560; Emesis/NG output:250; Chest Tube:115]  Wt Readings from Last 3 Encounters:   11/15/16 114 lb 13.8 oz (52.1 kg)   2016/11/18 114 lb (51.7 kg)   11/05/16 121 lb (54.9 kg)     Physical Exam   Gen: chronically ill appearing, mal-nourished, caucasian male intubated    HEENT: atraumatic, no obvious lesions/abrasions, pupils equal and reactive to light bilaterally  CV: tachycardic, regular rhythm, no murmurs appreciated  Chest Wall: crepitus throughout chest cavity  Lungs: coarse, diminished, exam noteable for subcutaneous emphysema from neck to lower abdomen   Abdomen:  exam noteable for subcutaneous emphysema from neck to lower abdomen   Extremities: 1+ pitting edema in LE bilaterally   Neuro: alert, answering questions by writing on piece of paper, pupils equal and reactive to light, wiggles toes to commands, moves both upper extremities spontaneously     Medications   Scheduled Meds:   acetaminophen  650 mg Oral TID    heparin (porcine)  5,000 Units Subcutaneous 3 times per day    oxyCODONE  10 mg Oral 4 times per day    oxyCODONE  5 mg Oral Once    pantoprazole  40 mg Intravenous DAILY 0600    senna-docusate  1 tablet Oral BID     PRN  Meds: carboxymethylcellulose, fentaNYL, fentaNYL    Antibiotics   -- None    Diet     Diet Orders          None          Labs     Renal  Recent Labs      2016/11/18   1211  11/15/16   0347  11/15/16   0759   NA  144  141  141   K  3.6  5.5*  6.0*   CL  101  103  102   CO2  28  27  31    BUN  13  19  22    CREATININE  1.02  1.27  1.45*   CALCIUM  9.5  8.8  9.0   MG  1.7  1.7   --    PHOS  3.3  1.1*  1.5*    CBC  Recent Labs      11/18/16   0830  November 18, 2016   1211  11/15/16   0347   HGB  13.6  11.7*  12.1*   HCT  41.3  35.3*  35.7*   PLT  629*  343  372   WBC  21.1*  18.4*  21.2*    Liver  Recent Labs      Nov 18, 2016   0830  11/18/2016   1211  11/15/16   0347  11/15/16   0759   AST   --   80*  36   --  ALT   --   58*  39   --    ALKPHOS   --   51  43   --    ALBUMIN   --   2.8*  2.8*  2.7*  2.7*  2.6*   BILITOT   --   0.9  0.5   --    BILIDIRECT   --   0.23  0.04   --    INR  1.0  1.3*   --    --           Assessment & Plan   Derrick Garner is a 66 y.o. male with history of COPD Stage 4 (baseline oxygen 3-4 and FEV1 20% in 2017), BPH s/p TURP c/b recurrent UTIs, several bladder stones, and bladder mucosa suspicious for bladder cancer who was transferred from [facility] ED for further management following cardiac arrest..     NEUROLOGY  #Pain  - Oxycodone 5mg  Q4H  - Tylenol TID  - Fentanyl PRN     PULMONOLOGY  #Acute hypoxic hypercapnic respiratory failure  - continue mechanical ventilation with setting as above  - not an optimal candidate for ECMO due to baseline lung disease. CT chest without contrast noteable for minimal lung parenchyma with bilateral pneumothorax     #Bilateral Pneumothorax   - continue chest tubes  - mechanical ventilation as listed above   - f/u CXR for improvement/resolution of PTX    #Subcutaneous Emphysema  - suspect worsening distribution with continued ventilation due to underlying lung disease   - manage pain with tylenol, oxy, and fentanyl   - further goals of care discussion once pt's  daughter presents at 2pm      CARDIOVASCULAR  #Cardiac Arrest   Most likely 2/2 respiratory failure leading to PEA followed by VF. ROSC achieved with CPR/Defib x4. TTE on 8/7 with probable nl LVEF and trivial pericardial effusion  - wean Levo as able to maintain MAP > 65    GASTROINTESTINAL  BM  NAI    RENAL  NAI     INFECTIOUS DISEASES  NAI     ENDOCRINE  NAI     HEMATOLOGY/ONCOLOGY  NAI     PSYCHIATRY  NAI     FEN/GI   -- Fluids: None   -- Electrolytes: None       DVT Prophylaxis: sqh  GI Prophylaxis: ppi  Family Communication: aware  Disposition: MICU     Other  HOB at 30 degrees    Arneta Cliche MD, PhD   PGY2 Internal Medicine  Pager Number [phone]  Pager ID [phone]      Images/Data     Arneta Cliche MD, PhD   PGY2 Internal Medicine  Pager Number [phone]  Pager ID [phone]  11/15/2016  4:35 PM

## 2016-11-15 NOTE — Unmapped (Signed)
Pt requested information about body donation.  I called [facility] and requested a packet of information.  This was faxed to the MICU unit and given to the pt and the family.  Should pt decide to move forward with body donation, he will need to complete the form in its entirety and signed by 2 witnesses that are not related to him.  An original will go to [facility] so would be best to keep the original copy in pt BS chart since pt will likely pass away at our hospital and ODA can forward the document.      SW will follow.

## 2016-11-16 MED ORDER — morPHINE injection 10 mg
4 | INTRAVENOUS | Status: AC | PRN
Start: 2016-11-16 — End: 2016-11-17

## 2016-11-16 MED ORDER — morphine 10 mg/mL injection 5 mg
10 | INTRAVENOUS | Status: AC | PRN
Start: 2016-11-16 — End: 2016-11-17

## 2016-11-16 MED ORDER — LORazepam (ATIVAN) 2 mg/mL injection
2 | INTRAMUSCULAR | Status: AC | PRN
Start: 2016-11-16 — End: 2016-11-17
  Administered 2016-11-17 (×6): 2 mg via INTRAVENOUS

## 2016-11-16 MED ORDER — morphine (PF) (INFUMORPH) 50 mg in sodium chloride 0.9 % 50 mL infusion
INTRAVENOUS | Status: AC
Start: 2016-11-16 — End: 2016-11-16
  Administered 2016-11-16: 0.1 mg/kg/h via INTRAVENOUS

## 2016-11-16 MED ORDER — morphine 10 mg/mL injection 5 mg
10 | INTRAVENOUS | Status: AC | PRN
Start: 2016-11-16 — End: 2016-11-17
  Administered 2016-11-16 (×4): 5 mg via INTRAVENOUS

## 2016-11-16 MED ORDER — morPHINE injection 10 mg
4 | INTRAVENOUS | Status: AC | PRN
Start: 2016-11-16 — End: 2016-11-17
  Administered 2016-11-17 (×4): 10 mg via INTRAVENOUS

## 2016-11-16 MED ORDER — ondansetron (ZOFRAN) injection 8 mg
4 | Freq: Four times a day (QID) | INTRAMUSCULAR | Status: AC | PRN
Start: 2016-11-16 — End: 2016-11-16

## 2016-11-16 MED ORDER — fentaNYL (SUBLIMAZE) injection 50 mcg
50 | INTRAMUSCULAR | Status: AC | PRN
Start: 2016-11-16 — End: 2016-11-16

## 2016-11-16 MED ORDER — LORazepam (ATIVAN) 0.16 mg/mL in sodium chloride 0.9 % 250 mL infusion
INTRAVENOUS | Status: AC
Start: 2016-11-16 — End: 2016-11-17
  Administered 2016-11-16: 1 mg/h via INTRAVENOUS

## 2016-11-16 MED ORDER — LORazepam (ATIVAN) 2 mg/mL injection
2 | INTRAMUSCULAR | Status: AC | PRN
Start: 2016-11-16 — End: 2016-11-17

## 2016-11-16 MED ORDER — atropine 1 % ophthalmic solution 1 drop
1 | Freq: Four times a day (QID) | OPHTHALMIC | Status: AC
Start: 2016-11-16 — End: 2016-11-17

## 2016-11-16 MED ORDER — ondansetron (ZOFRAN) injection 4 mg
4 | Freq: Four times a day (QID) | INTRAMUSCULAR | Status: AC | PRN
Start: 2016-11-16 — End: 2016-11-16

## 2016-11-16 MED ORDER — morphine (PF) (INFUMORPH) 50 mg in sodium chloride 0.9 % 50 mL infusion
INTRAVENOUS | Status: AC
Start: 2016-11-16 — End: 2016-11-17
  Administered 2016-11-17: 03:00:00 10 mg/h via INTRAVENOUS
  Administered 2016-11-17: 6 mg/h via INTRAVENOUS

## 2016-11-16 MED ORDER — fentaNYLSUBLIMAZEinjection100mcg
50 | INTRAMUSCULAR | Status: AC | PRN
Start: 2016-11-16 — End: 2016-11-16
  Administered 2016-11-16 (×6): 100 ug via INTRAVENOUS

## 2016-11-16 MED FILL — OXYCODONE 5 MG TABLET: 5 5 MG | ORAL | Qty: 1

## 2016-11-16 MED FILL — MORPHINE 4 MG/ML INTRAVENOUS SOLUTION: 4 4 mg/mL | INTRAVENOUS | Qty: 3

## 2016-11-16 MED FILL — FENTANYL (PF) 50 MCG/ML INJECTION SOLUTION: 50 50 mcg/mL | INTRAMUSCULAR | Qty: 2

## 2016-11-16 MED FILL — OXYCODONE 5 MG TABLET: 5 5 MG | ORAL | Qty: 2

## 2016-11-16 MED FILL — LORAZEPAM 4 MG/ML INJECTION SOLUTION: 4 4 mg/mL | INTRAMUSCULAR | Qty: 10

## 2016-11-16 MED FILL — TYLENOL 325 MG TABLET: 325 325 mg | ORAL | Qty: 2

## 2016-11-16 MED FILL — PROTONIX 40 MG INTRAVENOUS SOLUTION: 40 40 mg | INTRAVENOUS | Qty: 40

## 2016-11-16 MED FILL — LORAZEPAM 2 MG/ML INJECTION SOLUTION: 2 2 mg/mL | INTRAMUSCULAR | Qty: 1

## 2016-11-16 MED FILL — REFRESH PLUS 0.5 % EYE DROPS IN A DROPPERETTE: 0.5 0.5 % | OPHTHALMIC | Qty: 1

## 2016-11-16 MED FILL — INFUMORPH P/F 10 MG/ML INJECTION SOLUTION: 10 10 mg/mL | INTRAMUSCULAR | Qty: 5

## 2016-11-16 MED FILL — ATROPINE 1 % EYE DROPS: 1 1 % | OPHTHALMIC | Qty: 5

## 2016-11-16 NOTE — Unmapped (Signed)
Department of Internal Medicine  MICU Progress Note    Derrick Garner  [id number]  2016-11-19  8:22 PM  MI15/UMICU-15    CODE  [facility]    Chief Complaint / Subjective     Derrick Garner is a 66 y.o. male with history of  COPD Stage 4 (baseline oxygen 3-4 and FEV1 20% in 2017), BPH s/p TURP c/b recurrent UTIs, several bladder stones, and bladder mucosa suspicious for bladder cancer who was transferred from [facility] ED for further management following cardiac arrest.  ??    Significant events over the past 24 hours include:   --Switched to comfort care yesterday. Today switched to [facility]. He has had troubles with pain control and thus today we began comfort care measures. Will start morphine drips today. Aim is to titrate drip to comfortable respiratory rate (<30)     DAILY PLAN   Will start morphine drips today. Aim is to titrate drip to comfortable respiratory rate (<30)     CONSULTS  -- None    DAILY LABS  -- None    Code Status: DNRCC  GI Prophylaxis: PPI  DVT Prophylaxis: SQH    Pressors: None  Drips: None  Sedation: PRN Oxy  Continuous Infusions:  ??? lorazepam (ATIVAN) infusion 2 mg/hr (19-Nov-2016 1959)   ??? morphine 6 mg/hr (19-Nov-2016 2019)       Lines:     Vent Mode: PC-AC  FiO2:  [40 %-45 %] 45 %  S RR:  [20] 20  PEEP/CPAP:  [8 cm H20] 8 cm H20    Access/Lines/Tubes/Drains     PIV  Chest tubes R and L  No central line  ETT  NG/OG    Mentation     CAM: Overall CAM-ICU : No Delirium  RASS: [place] Agitation Sedation Scale: 0    Continuous Fluids   Continuous Infusions:  ??? lorazepam (ATIVAN) infusion 2 mg/hr (2016-11-19 1959)   ??? morphine 6 mg/hr (2016-11-19 2019)         Intake / Output       Intake/Output Summary (Last 24 hours) at 11-19-16 2022  Last data filed at November 19, 2016 0800   Gross per 24 hour   Intake               60 ml   Output              370 ml   Net             -310 ml       Foley removal: None  Restraints: None    Vent Settings   Vent Mode: PC-AC  FiO2:  [40 %-45 %] 45 %  S RR:  [20] 20  PEEP/CPAP:  [8 cm  H20] 8 cm H20    Lactate:   Recent Labs      11/08/2016   0830  11/11/2016   1211  11/15/16   0347   LACTATE  0.7  7.1*  3.3*       ABG:   Recent Labs      11/13/2016   0830   PCO2  123*         Physical Exam   BP  Min: 88/59  Max: 130/94  No Data Recorded  Temp  Avg: 98.1 ??F (36.7 ??C)  Min: 97.8 ??F (36.6 ??C)  Max: 98.7 ??F (37.1 ??C)  Pulse  Avg: 95.8  Min: 84  Max: 103  Resp  Avg: 20.7  Min: 17  Max: 24  SpO2  Avg: 92.3 %  Min: 88 %  Max: 100 %  BP  Min: 88/59  Max: 130/94  MAP (mmHg)  Avg: 75.2  Min: 64  Max: 104  MAP (mmHg)  Avg: 75.2  Min: 64  Max: 104    Intake/Output Summary (Last 24 hours) at December 10, 2016 2022  Last data filed at 2016-12-10 0800   Gross per 24 hour   Intake               60 ml   Output              370 ml   Net             -310 ml     I/O last 3 completed shifts:  In: 402 [I.V.:12; NG/GT:390]  Out: 665 [Urine:625; Chest Tube:40]  Wt Readings from Last 3 Encounters:   12/10/2016 115 lb 8.3 oz (52.4 kg)   11/10/2016 114 lb (51.7 kg)   11/05/16 121 lb (54.9 kg)     Physical Exam   Gen: chronically ill appearing, mal-nourished, caucasian male intubated    HEENT: atraumatic, no obvious lesions/abrasions, pupils equal and reactive to light bilaterally  CV: tachycardic, regular rhythm, no murmurs appreciated  Chest Wall: crepitus throughout chest cavity  Lungs: coarse, diminished, exam noteable for subcutaneous emphysema from neck to lower abdomen   Abdomen:  exam noteable for subcutaneous emphysema from neck to lower abdomen   Extremities: 1+ pitting edema in LE bilaterally   Neuro: alert, answering questions by writing on piece of paper, pupils equal and reactive to light, wiggles toes to commands, moves both upper extremities spontaneously     Medications   Scheduled Meds:  ??? atropine  1 drop Oral QID   ??? oxyCODONE  10 mg Oral Q4H   ??? oxyCODONE  5 mg Oral Once   ??? pantoprazole  40 mg Intravenous DAILY 0600     PRN Meds: carboxymethylcellulose, LORazepam **OR** LORazepam, morphine **OR** morphine, morphine **OR**  morphine    Antibiotics   -- None    Diet     Diet Orders          None          Labs     Renal  Recent Labs      10/28/2016   1211  11/15/16   0347  11/15/16   0759   NA  144  141  141   K  3.6  5.5*  6.0*   CL  101  103  102   CO2  28  27  31    BUN  13  19  22    CREATININE  1.02  1.27  1.45*   CALCIUM  9.5  8.8  9.0   MG  1.7  1.7   --    PHOS  3.3  1.1*  1.5*    CBC  Recent Labs      11/17/2016   0830  11/01/2016   1211  11/15/16   0347   HGB  13.6  11.7*  12.1*   HCT  41.3  35.3*  35.7*   PLT  629*  343  372   WBC  21.1*  18.4*  21.2*    Liver  Recent Labs      11/01/2016   0830  11/04/2016   1211  11/15/16   0347  11/15/16   0759   AST   --   80*  36   --  ALT   --   58*  39   --    ALKPHOS   --   51  43   --    ALBUMIN   --   2.8*   2.8*  2.7*   2.7*  2.6*   BILITOT   --   0.9  0.5   --    BILIDIRECT   --   0.23  0.04   --    INR  1.0  1.3*   --    --           Assessment & Plan   Derrick Garner is a 66 y.o. male with history of COPD Stage 4 (baseline oxygen 3-4 and FEV1 20% in 2017), BPH s/p TURP c/b recurrent UTIs, several bladder stones, and bladder mucosa suspicious for bladder cancer who was transferred from [facility] ED for further management following cardiac arrest..     NEUROLOGY  #Pain  - Oxycodone 5mg  Q4H  - Tylenol TID  - Fentanyl PRN   ??  PULMONOLOGY  #Acute hypoxic hypercapnic respiratory failure  - withdrawing care when patient is stable. Will start morphine drips today. Aim is to titrate drip to comfortable respiratory rate (<30)   - continue mechanical ventilation with setting as above  - not an optimal candidate for ECMO due to baseline lung disease. CT chest without contrast noteable for minimal lung parenchyma with bilateral pneumothorax     #Bilateral Pneumothorax   - continue chest tubes  - mechanical ventilation as listed above   - f/u CXR for improvement/resolution of PTX  ??  #Subcutaneous Emphysema  - suspect worsening distribution with continued ventilation due to underlying lung disease    - manage pain with tylenol, oxy, and fentanyl   - further goals of care discussion once pt's daughter presents at 2pm   ??   CARDIOVASCULAR  #Cardiac Arrest   Most likely 2/2 respiratory failure leading to PEA followed by VF. ROSC achieved with CPR/Defib x4. TTE on 8/7 with probable nl LVEF and trivial pericardial effusion  - wean Levo as able to maintain MAP > 65    GASTROINTESTINAL  BM  NAI    RENAL  NAI     INFECTIOUS DISEASES  NAI     ENDOCRINE  NAI     HEMATOLOGY/ONCOLOGY  NAI     PSYCHIATRY  NAI     FEN/GI   -- Fluids: None   -- Electrolytes: None       DVT Prophylaxis: sqh  GI Prophylaxis: ppi  Family Communication: aware  Disposition: MICU     Other  HOB at 30 degrees    Arneta Cliche MD, PhD   PGY2 Internal Medicine  Pager Number [phone]  Pager ID [phone]      Images/Data     Arneta Cliche MD, PhD   PGY2 Internal Medicine  Pager Number [phone]  Pager ID [phone]  11/06/2016  8:22 PM

## 2016-11-16 NOTE — Unmapped (Signed)
[facility]  Inpatient Death Note     Patient: Derrick Garner  Age: 66 y.o.    MRN: [id number]   CSN: [id number]    Date and Time of Death   Patient pronounced dead at 11:34 on 12/01/2016    Death Pronouncement   I attended to pronounce [name] dead. Patient was properly identified by name bracelet.   Patient found lying in bed, unresponsive to verbal or noxious stimuli.  Pupils fixed and dilated. Corneal blink reflex was absent. No heart or breath sounds auscultated.  No palpable pulses.  No spontaneous respiration observed.        Preliminary Cause of Death      Respiratory Failure    Notifications   Attending Physician, Dr. Carlena Hurl Ramser, DO   Next of kin/family was present.    Lelon Mast, DO

## 2016-11-16 NOTE — Unmapped (Signed)
Responded to referral from nurse.  Pt's family was bedside.  Provided spiritual care through pastoral presence and prayer.  o additional needs stated at this time.    Peri Jefferson, MDiv, BCCC,BCPC

## 2016-11-16 NOTE — Unmapped (Signed)
Pt code status changed to [facility]. This RN notified ODA that patient and family had made decision to withdraw care and extubate. Pt and family deferred assessment. Comfort Care measures and gtt started. Family at bedside and comfortable with plan. Comfort will continue to be monitored closely.

## 2016-11-16 NOTE — Unmapped (Signed)
MICU ATTENDING DAILY PROGRESS NOTE    I independently saw and examined this patient.  The x-rays and labs were reviewed.  The case was discussed in detail during multidisciplinary rounds, the events of the past 24 hours reviewed, and a plan for medical care arranged.     This is a 66 yo male that presented on 2016/12/06 in transfer form [facility] with hypoxic respiratory failure and a cardiac arrest which occurred following intubation.   Has a known hx of known very severe COPD (FEV1 of 700cc or 20% predicted on 02/2016).     11/19/2016 - Spoke with patient in presence of Sister and Daughter.  He clearly describes the desire to begin comfort care and end heroic measures. Bed side trial of PSV/CPAP attempted but he became SOB  And anxious very quickly.    BP 109/64    Pulse 96    Temp 97.9 ??F (36.6 ??C) (Oral)    Resp 21    Ht 5' 10 (1.778 m)    Wt 115 lb 8.3 oz (52.4 kg)    SpO2 91%    BMI 16.58 kg/m??     Vent settings:  PCV 27 cm H2O / Rate 20 bpm / FiO2 40%/ PEEP +8  Right chest tube with small air leak  Left chest tube with near continuous air leak.    Assessment and Plan  --s/p Cardiac Arrest  --Acute on Chronic Respiratory Failure secondary to an exacerbation of Very Severe COPD  --Bilateral Pneumothorax  --Acute Kidney Injury  --Severe BPH s/p TURP approx 1 weeks ago    We discussed that no diagnostic studies were preformed today given plans for comfort care and withdraw of support later today.  He continues to indicate the desire to withdraw supportive measures once his family has arrived this evening.  He does want supplemental Oxygen and the ability to drink fluids. His Sister and Daughter was present for discussion - all questions were answered to the best of my abilities.    ICU Checklist  DVT Prophylaxis: Other: DC - comfort care  GI Ulcer Prophylaxis: PPI  Nutrition: NPO  Invasive Line:   Patient Lines/Drains/Airways Status    Active Line / PIV Line     Name:   Placement date:   Placement time:   Site:   Days:     Peripheral IV 06-Dec-2016 Right Antecubital  2016/12/06    0846    Antecubital    2    Peripheral IV December 06, 2016 Left   Dec 06, 2016    1134        1    Peripheral IV 2016/12/06 Right   06-Dec-2016    1235        1              Mobility: Bedrest  Foley with Order for Insertion & Maintenance: None  Lab Orders: Reviewed  Code Status: DNRCC-A  Family: Updated at bedsde    I spent a total of 32 minutes of critical care time caring for this patient with respiratory failure and severe COPD, including direct patient contact, management of life support systems review of data (i.e.: imaging and lab), discussion with team members, and this time excludes time spent on procedures    [employer], DO  11:06 AM, 11/12/2016

## 2016-11-16 NOTE — Unmapped (Signed)
Pt daughter arrived last night.      I met with pt sister who reports pt plans to remove life support at 6pm this evening and is expected to pass quickly.  SW provided support.

## 2016-11-16 NOTE — Unmapped (Signed)
Patient extubated per patient and families wishes. Placed on NC 4L.

## 2016-11-16 NOTE — Unmapped (Signed)
Problem: Acute Pain  Patient's pain progressing toward patient's stated pain goal   Goal: Patient displays improved well-being such as baseline levels for pulse, BP, respirations and relaxed muscle tone or body posture  Pt with bilateral chest tubes, increasing pain on right side. Pt with decreased heart rate with pain intervention and repositioning.

## 2016-11-16 NOTE — Unmapped (Signed)
Problem: Potential for Compromised Skin Integrity  Goal: Skin integrity is maintained or improved  Assess and monitor skin integrity. Identify patients at risk for skin breakdown on admission and per policy. Collaborate with interdisciplinary team and initiate plans and interventions as needed.   Outcome: Progressing  Pt turned q2h, heels floated off bed, skin clean and dry, preventative mepilex on sacrum, pillow support, low air-loss sleep surface, NPO at this time. Will continue to monitor.       Problem: Fall Prevention  Goal: Patient will remain free of falls  Assess and monitor vitals signs, neurological status including level of consciousness and orientation.  Reassess fall risk per hospital policy.    Ensure arm band on, uncluttered walking paths in room, adequate room lighting, call light and overbed table within reach, bed in low position, wheels locked, side rails up per policy (excluding SNF), and non-skid footwear provided.   Outcome: Progressing  Bed in lowest position, side rails up x3, fall risk bracelet on, non-slip socks on, bed alarm on, call light in reach, bedside table in reach. Will continue to monitor.

## 2016-11-16 NOTE — Progress Notes (Addendum)
[facility] Adult Death        PATIENT INFORMATION:    Date of Admission: 12/04/16 11:25 AM  Chief Complaint: No chief complaint on file.    Date of Death: 12-06-2016  Time of Death: 05/13/2332  Pronouncing Physician: Lelon Mast DO  Death certified by: Ranae Palms, DO  Phone Number: [phone]  Lic Number of person who certified death: [id number]  PCP: Donzetta Starch, MD  PCP Phone Number: [phone]  Meets criteria for report to coroner?: No     Autopsy Approval: Declined  [facility]: Outcome/Status: [facility] to contact family (in process)  Disposition decision: Undecided (decided not to enroll in body donation)       NOK INFORMATION:    NOK/Authorized person: Jacaleb Puhr (or pt's sister Collene Gobble)  Relationship: Child  Telephone Number: [phone]  Alternate telephone number: Lupita Leash: [phone]  Address: [address] [place], [place]  [address]  Notification Date: Dec 06, 2016    NOK/family notification and grief reaction: [name], 66 y/o DWM. NOK is his daughter Jettson Blais who was present at TOD. Also present was Guido's sister, Collene Gobble. [name] was tearful and Family accepted SW intervention.    Support System: family    Living situation prior to hospitalization: pt resided with his brother who provided him with support    Cultural/Spiritual/Language: no barriers identified    Spiritual Care:  hx    Provide Grief Support Resources: booklet; resource sheet    Belongings Disposition: Sent with family (NOK reports that she will take cell phone and charger home)    Name of Funeral Home:  TBD    SW Assessment: SW advised of pt's death by RN  and presented to unit.  Family elected to discuss next steps in the pt's room. SW introduced self, offered condolences, and advised family of SW role. Family was approachable and receptive to SW intervention. Social worker spent time with the family to explain Next Steps and provided grief support. Family is deciding on cremation and once a decision is made th ept's  daughter or his sister will contact [facility]. They advised that they expect to complete this task within the two week time limit. Family acknowledged understanding and expressed that they were in agreement with the plan as discussed.    [facility] social worker notified Peggy of this patient's death on the MICU dedicated line.       Hand off of Care:       Cooler location and time  [facility] outcome  Disposition agent (Dtr Roscoe or pt's sister Lupita Leash VQQ select Disposition agent)

## 2016-11-17 NOTE — Unmapped (Deleted)
[facility]  Department of Internal Medicine  Inpatient Discharge Summary    Patient: Derrick Garner   MRN: [id number]   CSN: [id number]    Date of Admission: 11/03/2016  Date of Discharge: 11/17/2016  Attending Physician: No att. providers found       Diagnoses Present on Admission     Past Medical History:   Diagnosis Date   ??? Anxiety    ??? Benign prostatic hyperplasia    ??? Bladder stones    ??? 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 History of alcohol abuse    ??? Hypoxemia requiring supplemental oxygen    ??? Tobacco abuse         Discharge Diagnoses     Active Hospital Problems    Diagnosis Date Noted   ??? Cardiac arrest (CMS Dx) [I46.9] 10/31/2016      Resolved Hospital Problems    Diagnosis Date Noted Date Resolved   No resolved problems to display.         Operations/Procedures Performed (include dates)     Surgeries:        Lines/Drains/Airways:  Patient Lines/Drains/Airways Status    Active Line / PIV Line     Name:   Placement date:   Placement time:   Site:   Days:    Peripheral IV 11/01/2016 Right Antecubital  11/09/2016    0846    Antecubital    3    Peripheral IV 11/06/2016 Left   11/12/2016    1134        3    Peripheral IV 11/23/2016 Right   11/19/2016    1235        3                  Notable Imaging Studies:  X-ray Portable Chest   Final Result   IMPRESSION:      Support devices as above.      Persistent subcutaneous emphysema, bilateral pneumothoraces and right pleural fluid.      Approved by Lamonte Richer on 11/06/2016 8:43 PM EDT      I have personally reviewed the images and I agree with this report.      Report Verified by: Letitia Caul at 11/15/2016 9:05 PM EDT      CT Chest WO contrast   Final Result   IMPRESSION:      Chest:   Large bilateral pneumothoraces with bilateral chest tubes in place. The right chest tube transverses the right lung parenchyma.      Small right pleural effusion.      Nondisplaced sternal and bilateral rib fractures as above,  likely related to chest compressions.      Consolidative and groundglass opacities predominantly in the lower lobes with adjacent areas of nodularity may be related to a combination of atelectasis and pneumonia, including aspiration.      New 10 mm nodule at the right lung base, indeterminate. Recommend a short-term follow up examination in 3 months.      Severe pulmonary emphysema.      Abdomen/pelvis:   Pneumoperitoneum with no definite evidence to suggest bowel perforation, likely related to extension from bilateral pneumothoraces.      Punctate bilateral nonobstructing renal calculi.      Circumferential bladder wall thickening, with slightly more asymmetric thickening near the bladder dome. Although this may be possibly related to recent urologic procedure, given the asymmetry an underlying bladder malignancy should  be considered. Consider correlation with direct visualization.      Trace ascites and extensive subcutaneous emphysema.      Approved by Rogue Bussing on 21-Nov-2016 2:54 PM EDT      I have personally reviewed the images and I agree with this report.      Report Verified by: Blenda Peals, M.D. at 11-21-16 3:13 PM EDT      CT Abdomen and Pelvis WO IV contrast   Final Result   IMPRESSION:      Chest:   Large bilateral pneumothoraces with bilateral chest tubes in place. The right chest tube transverses the right lung parenchyma.      Small right pleural effusion.      Nondisplaced sternal and bilateral rib fractures as above, likely related to chest compressions.      Consolidative and groundglass opacities predominantly in the lower lobes with adjacent areas of nodularity may be related to a combination of atelectasis and pneumonia, including aspiration.      New 10 mm nodule at the right lung base, indeterminate. Recommend a short-term follow up examination in 3 months.      Severe pulmonary emphysema.      Abdomen/pelvis:   Pneumoperitoneum with no definite evidence to suggest bowel perforation,  likely related to extension from bilateral pneumothoraces.      Punctate bilateral nonobstructing renal calculi.      Circumferential bladder wall thickening, with slightly more asymmetric thickening near the bladder dome. Although this may be possibly related to recent urologic procedure, given the asymmetry an underlying bladder malignancy should be considered. Consider correlation with direct visualization.      Trace ascites and extensive subcutaneous emphysema.      Approved by Rogue Bussing on 2016-11-21 2:54 PM EDT      I have personally reviewed the images and I agree with this report.      Report Verified by: Blenda Peals, M.D. at 2016-11-21 3:13 PM EDT      CT Head WO contrast   Final Result   IMPRESSION:      No evidence of intracranial mass lesion, hemorrhage or mass effect.      Mild to moderate small vessel ischemic disease.      Report Verified by: Drake Leach, M.D. at 11-21-16 2:50 PM EDT      X-ray Portable Chest   Final Result   IMPRESSION:    Chest:   Apparent enlargement of large right and small left pneumothoraces with bilateral chest tubes in place, although direct comparison is limited by differences in projection.      Multifocal airspace disease, not significantly changed.      Abdomen:   Large area of lucency in the right hemiabdomen is of uncertain significance, possibly artifactual. No definite evidence of pneumoperitoneum. Nonobstructive bowel gas pattern.      Pelvis:   Apparent soft tissue defect involving the scrotum and medial thighs may be artifactual, postoperative or related to infection.      Approved by Rogue Bussing on 11-21-2016 12:46 PM EDT      I have personally reviewed the images and I agree with this report.      Report Verified by: Blenda Peals, M.D. at 11-21-2016 12:59 PM EDT      X-ray Portable Abdomen AP view   Final Result   IMPRESSION:    Chest:   Apparent enlargement of large right and small left pneumothoraces with bilateral chest tubes in place, although direct  comparison is limited by  differences in projection.      Multifocal airspace disease, not significantly changed.      Abdomen:   Large area of lucency in the right hemiabdomen is of uncertain significance, possibly artifactual. No definite evidence of pneumoperitoneum. Nonobstructive bowel gas pattern.      Pelvis:   Apparent soft tissue defect involving the scrotum and medial thighs may be artifactual, postoperative or related to infection.      Approved by Rogue Bussing on 11/21/2016 12:46 PM EDT      I have personally reviewed the images and I agree with this report.      Report Verified by: Blenda Peals, M.D. at 11/22/2016 12:59 PM EDT      X-ray Pelvis 1 or 2-views   Final Result   IMPRESSION:    Chest:   Apparent enlargement of large right and small left pneumothoraces with bilateral chest tubes in place, although direct comparison is limited by differences in projection.      Multifocal airspace disease, not significantly changed.      Abdomen:   Large area of lucency in the right hemiabdomen is of uncertain significance, possibly artifactual. No definite evidence of pneumoperitoneum. Nonobstructive bowel gas pattern.      Pelvis:   Apparent soft tissue defect involving the scrotum and medial thighs may be artifactual, postoperative or related to infection.      Approved by Rogue Bussing on 11/05/2016 12:46 PM EDT      I have personally reviewed the images and I agree with this report.      Report Verified by: Blenda Peals, M.D. at 10/31/2016 12:59 PM EDT        Other Procedures:      Consulting Services (include reason)       Allergies            Discharge Medications        Medication List      ASK your doctor about these medications      Quantity/Refills   ADVIL 200 MG tablet  Generic drug:  ibuprofen  Take 400 mg by mouth if needed.   Refills:  0     * albuterol 2.5 mg /3 mL (0.083 %) nebulizer solution  Commonly known as:  PROVENTIL  Inhale 2.5 mg by nebulization every 6 hours as needed.   Refills:  0      * albuterol 90 mcg/actuation inhaler  Commonly known as:  PROVENTIL;VENTOLIN;PROAIR  Inhale 2 puffs into the lungs every 4 hours as needed for Wheezing.   Quantity:  1 Inhaler  Refills:  11     * ALPRAZolam 1 MG tablet  Commonly known as:  XANAX  Take 2 mg by mouth after lunch. Between 1400-1500   Refills:  0     * ALPRAZolam 1 MG tablet  Commonly known as:  XANAX  Take 1 mg by mouth 2 times a day.   Refills:  0     BENADRYL 25 mg capsule  Generic drug:  diphenhydrAMINE  Take 25-50 mg by mouth if needed.   Refills:  0     ENSURE ORIGINAL Liqd  Generic drug:  food supplemt, lactose-reduced  Take 1 Can by mouth 3 times a day. vanilla   Refills:  0     fluticasone-salmeterol 250-50 mcg/dose diskus inhaler  Commonly known as:  ADVAIR  Inhale 2 puffs into the lungs 2 times a day.   Refills:  0     oxyCODONE 5 MG immediate release  tablet  Commonly known as:  ROXICODONE  Take 1 tablet (5 mg total) by mouth every 6 hours as needed for up to 7 days.  Ask about: Should I take this medication?   Quantity:  20 tablet  Refills:  0     senna-docusate 8.6-50 mg per tablet  Commonly known as:  SENNA-S  Take 1 tablet by mouth 2 times a day.   Quantity:  30 tablet  Refills:  0     tamsulosin 0.4 mg Cap  Commonly known as:  FLOMAX  TAKE ONE CAPSULE BY MOUTH ONCE DAILY AT BEDTIME   Quantity:  30 capsule  Refills:  3     tiotropium 18 mcg  Commonly known as:  SPIRIVA  Inhale 1 capsule (18 mcg total) into the lungs daily.   Quantity:  30 capsule  Refills:  6        * This list has 4 medication(s) that are the same as other medications prescribed for you. Read the directions carefully, and ask your doctor or other care provider to review them with you.                Reason for Admission     Marvie Calender is a 66 y.o. male with PMHx of COPD Stage 4 (baseline oxygen 3-4 and FEV1 20% in 2017), BPH s/p TURP c/b recurrent UTIs, several bladder stones, and bladder mucosa suspicious for bladder cancer who was transferred from [facility]  ED for further management following cardiac arrest.    Hospital Course By Problem     #Bilateral Pneumothorax.  #Acute hypoxic hypercapnic respiratory failure. Secondary to pneumothorax in the setting of severe COPD. We continued mechanical ventilation until the day of terminal extubation. Patient was not an optimal candidate for ECMO due to baseline lung disease. CT chest without contrast noteable for minimal lung parenchyma with bilateral pneumothorax     #Subcutaneous Emphysema: Suspect worsening distribution secondary to ventilation due to underlying lung disease and unsealed chest tubes. Managed pain with drips based on comfort care order sets.    #Cardiac Arrest: Most likely 2/2 respiratory failure leading to PEA followed by VF. ROSC achieved with CPR/Defib x4. TTE on 8/7 with probable nl LVEF and trivial pericardial effusion. Weaned Levo as able to maintain MAP > 65. Patient had full neurological recovery prior to withdrawal of care.     Condition on Discharge     1. Functional Status: Abnormal - Description: respiratory failure    2. Mental Status: Normal    3. Diet / Tube Feeding / TPN:  Diet Orders          None        As listed above    4. Respiratory / Lines & Tubes / Wounds:  None required    5. Discharge Physical Exam:  BP (!) 50/36    Pulse 54    Temp 97.8 ??F (36.6 ??C) (Oral)    Resp (!) 0    Ht 5' 10 (1.778 m)    Wt 115 lb 8.3 oz (52.4 kg)    SpO2 (!) 40%    BMI 16.58 kg/m??        Core Measure Documentation (As Applicable)     Most Recent Wt: Weight: 115 lb 8.3 oz (52.4 kg)         Core Measure Documentation  Was the Influenza Vaccine Screening Completed?: No, April-September Discharge  The core measures checklist is complete for discharge?: Yes  Disposition     Deceased    Follow-Up Appointments     No future appointments.    Radford Pax, MD  [address]  [place]  [place] [address]  [phone]          Patient Instructions / Follow-Up Items for Receiving Physician     Signed:    Arneta Cliche MD, PhD   PGY2 Internal Medicine  Pager Number [phone]  Pager ID [phone]  11/17/2016, 5:00 PM

## 2016-11-17 NOTE — Unmapped (Signed)
TOD 2334, pronounced by Dittman DO, this RN present at turned off comfort gtt. Family at bedside. [facility] called by this RN, [facility] also called.

## 2016-11-17 NOTE — Unmapped (Signed)
11/18/16    [facility] SW:     Wilkie Aye of [facility] called to inquire about Pt's release. SW advised that consent from [facility] was needed.     SW called and left messages for Pt's daughter Jeorge Reister and Pt's sister Collene Gobble to call ODA.      SW received consent from Pt's daughter Mclain Freer for Pt to be released to [facility]. SW notified Wilkie Aye of [facility] that Pt is ready for release and provided her with needed death certificate information.     Hand off of Care:  Release Pt to [facility]    Merrilee Seashore, MSW, LSW  [phone]

## 2016-11-17 NOTE — Progress Notes (Deleted)
TOD 2334, pronounced by Willaim Bane, MD with this RN present. All comfort gtt turned off at TOD. Family at bedside. [facility] and [facility] contacted. [facility]DA currently at bedside speaking with family.

## 2016-11-17 NOTE — Unmapped (Signed)
11/18/16    [facility] SW:     SW released Pt to [facility] at 20:49 this date.     Merrilee Seashore, MSW, LSW  [phone]

## 2016-11-17 NOTE — Unmapped (Signed)
[facility]  Death Discharge Summary     Patient: Derrick Garner  Age: 66 y.o.    MRN: [id number]   CSN: [id number]    Date of Admission: Dec 13, 2016  Date of Death:December 15, 2016  Attending Physician: No att. providers found   Primary Care Physician: Donzetta Starch, MD     Diagnoses Present on Admission     Active Hospital Problems    Diagnosis Date Noted   ??? Cardiac arrest (CMS Dx) [I46.9] Dec 13, 2016      Resolved Hospital Problems    Diagnosis Date Noted Date Resolved   No resolved problems to display.       Operations/Procedures Performed (include dates)   Surgeries:      Lines and tubes:  Patient Lines/Drains/Airways Status    Active Line / PIV Line     Name:   Placement date:   Placement time:   Site:   Days:    Peripheral IV 12-13-16 Right Antecubital  12/13/2016    0846    Antecubital    3    Peripheral IV 12/13/2016 Left   2016-12-13    1134        3    Peripheral IV 12/13/2016 Right   12/13/16    1235        3                Other Procedures / Pertinent Imaging:  X-ray Portable Chest   Final Result   IMPRESSION:      Support devices as above.      Persistent subcutaneous emphysema, bilateral pneumothoraces and right pleural fluid.      Approved by Lamonte Richer on 13-Dec-2016 8:43 PM EDT      I have personally reviewed the images and I agree with this report.      Report Verified by: Letitia Caul at 13-Dec-2016 9:05 PM EDT      CT Chest WO contrast   Final Result   IMPRESSION:      Chest:   Large bilateral pneumothoraces with bilateral chest tubes in place. The right chest tube transverses the right lung parenchyma.      Small right pleural effusion.      Nondisplaced sternal and bilateral rib fractures as above, likely related to chest compressions.      Consolidative and groundglass opacities predominantly in the lower lobes with adjacent areas of nodularity may be related to a combination of atelectasis and pneumonia, including aspiration.      New 10 mm nodule at the right lung base, indeterminate. Recommend a short-term  follow up examination in 3 months.      Severe pulmonary emphysema.      Abdomen/pelvis:   Pneumoperitoneum with no definite evidence to suggest bowel perforation, likely related to extension from bilateral pneumothoraces.      Punctate bilateral nonobstructing renal calculi.      Circumferential bladder wall thickening, with slightly more asymmetric thickening near the bladder dome. Although this may be possibly related to recent urologic procedure, given the asymmetry an underlying bladder malignancy should be considered. Consider correlation with direct visualization.      Trace ascites and extensive subcutaneous emphysema.      Approved by Rogue Bussing on 12-13-16 2:54 PM EDT      I have personally reviewed the images and I agree with this report.      Report Verified by: Blenda Peals, M.D. at 12-13-16 3:13 PM EDT      CT Abdomen and  Pelvis WO IV contrast   Final Result   IMPRESSION:      Chest:   Large bilateral pneumothoraces with bilateral chest tubes in place. The right chest tube transverses the right lung parenchyma.      Small right pleural effusion.      Nondisplaced sternal and bilateral rib fractures as above, likely related to chest compressions.      Consolidative and groundglass opacities predominantly in the lower lobes with adjacent areas of nodularity may be related to a combination of atelectasis and pneumonia, including aspiration.      New 10 mm nodule at the right lung base, indeterminate. Recommend a short-term follow up examination in 3 months.      Severe pulmonary emphysema.      Abdomen/pelvis:   Pneumoperitoneum with no definite evidence to suggest bowel perforation, likely related to extension from bilateral pneumothoraces.      Punctate bilateral nonobstructing renal calculi.      Circumferential bladder wall thickening, with slightly more asymmetric thickening near the bladder dome. Although this may be possibly related to recent urologic procedure, given the asymmetry an  underlying bladder malignancy should be considered. Consider correlation with direct visualization.      Trace ascites and extensive subcutaneous emphysema.      Approved by Rogue Bussing on 11/02/2016 2:54 PM EDT      I have personally reviewed the images and I agree with this report.      Report Verified by: Blenda Peals, M.D. at 11/06/2016 3:13 PM EDT      CT Head WO contrast   Final Result   IMPRESSION:      No evidence of intracranial mass lesion, hemorrhage or mass effect.      Mild to moderate small vessel ischemic disease.      Report Verified by: Drake Leach, M.D. at 11/13/2016 2:50 PM EDT      X-ray Portable Chest   Final Result   IMPRESSION:    Chest:   Apparent enlargement of large right and small left pneumothoraces with bilateral chest tubes in place, although direct comparison is limited by differences in projection.      Multifocal airspace disease, not significantly changed.      Abdomen:   Large area of lucency in the right hemiabdomen is of uncertain significance, possibly artifactual. No definite evidence of pneumoperitoneum. Nonobstructive bowel gas pattern.      Pelvis:   Apparent soft tissue defect involving the scrotum and medial thighs may be artifactual, postoperative or related to infection.      Approved by Rogue Bussing on 11/13/2016 12:46 PM EDT      I have personally reviewed the images and I agree with this report.      Report Verified by: Blenda Peals, M.D. at 11/18/2016 12:59 PM EDT      X-ray Portable Abdomen AP view   Final Result   IMPRESSION:    Chest:   Apparent enlargement of large right and small left pneumothoraces with bilateral chest tubes in place, although direct comparison is limited by differences in projection.      Multifocal airspace disease, not significantly changed.      Abdomen:   Large area of lucency in the right hemiabdomen is of uncertain significance, possibly artifactual. No definite evidence of pneumoperitoneum. Nonobstructive bowel gas pattern.       Pelvis:   Apparent soft tissue defect involving the scrotum and medial thighs may be artifactual, postoperative or related to infection.  Approved by Rogue Bussing on 11/20/2016 12:46 PM EDT      I have personally reviewed the images and I agree with this report.      Report Verified by: Blenda Peals, M.D. at 11/06/2016 12:59 PM EDT      X-ray Pelvis 1 or 2-views   Final Result   IMPRESSION:    Chest:   Apparent enlargement of large right and small left pneumothoraces with bilateral chest tubes in place, although direct comparison is limited by differences in projection.      Multifocal airspace disease, not significantly changed.      Abdomen:   Large area of lucency in the right hemiabdomen is of uncertain significance, possibly artifactual. No definite evidence of pneumoperitoneum. Nonobstructive bowel gas pattern.      Pelvis:   Apparent soft tissue defect involving the scrotum and medial thighs may be artifactual, postoperative or related to infection.      Approved by Rogue Bussing on 11/06/2016 12:46 PM EDT      I have personally reviewed the images and I agree with this report.      Report Verified by: Blenda Peals, M.D. at 11/24/2016 12:59 PM EDT            Consulting Services (include reason)       Preliminary Cause of Death       Cause of Death   ??  #Bilateral Pneumothorax.  #Acute hypoxic hypercapnic respiratory failure. Secondary to pneumothorax in the setting of severe COPD. We continued mechanical ventilation until the day of terminal extubation. Patient was not an optimal candidate for ECMO due to baseline lung disease. CT chest without contrast noteable for minimal lung parenchyma with bilateral pneumothorax   ??  #Subcutaneous Emphysema: Suspect worsening distribution secondary to ventilation due to underlying lung disease and unsealed chest tubes. Managed pain with drips based on comfort care order sets.  ??  #Cardiac Arrest: Most likely 2/2 respiratory failure leading to PEA followed by VF.  ROSC achieved with CPR/Defib x4. TTE on 8/7 with probable nl LVEF and trivial pericardial effusion. Weaned Levo as able to maintain MAP > 65. Patient had full neurological recovery prior to withdrawal of care.     Hospital Course     #Bilateral Pneumothorax.  #Acute hypoxic hypercapnic respiratory failure. Secondary to pneumothorax in the setting of severe COPD. We continued mechanical ventilation until the day of terminal extubation. Patient was not an optimal candidate for ECMO due to baseline lung disease. CT chest without contrast noteable for minimal lung parenchyma with bilateral pneumothorax   ??  #Subcutaneous Emphysema: Suspect worsening distribution secondary to ventilation due to underlying lung disease and unsealed chest tubes. Managed pain with drips based on comfort care order sets.  ??  #Cardiac Arrest: Most likely 2/2 respiratory failure leading to PEA followed by VF. ROSC achieved with CPR/Defib x4. TTE on 8/7 with probable nl LVEF and trivial pericardial effusion. Weaned Levo as able to maintain MAP > 65. Patient had full neurological recovery prior to withdrawal of care.       Arneta Cliche MD, PhD   PGY2 Internal Medicine  Pager Number [phone]  Pager ID [phone]  11/17/2016, 5:11 PM

## 2016-11-27 DEATH — deceased

## 2017-04-22 ENCOUNTER — Ambulatory Visit: Payer: Medicare (Managed Care)

## 2022-06-02 IMAGING — MR MRI CERVICAL SPINE WITHOUT CONTRAST
6 of 7 series · 31 of 48 positions shown · IV contrast (gadolinium)
Comparison: Cervical spine x-ray April 20, 2022.

MRI CERVICAL SPINE WITHOUT CONTRAST , 06/02/2022 [DATE]: 
CLINICAL INDICATION: Cervical degenerative disc disease. Worsening neck pain.
TECHNIQUE: Multiplanar, multiecho position MR images of the cervical spine were 
performed without intravenous gadolinium enhancement.

[Series 101: survey · axial · 10.0mm · 0.94mm/px · 1 of 9 slices shown]
[im 1/9]
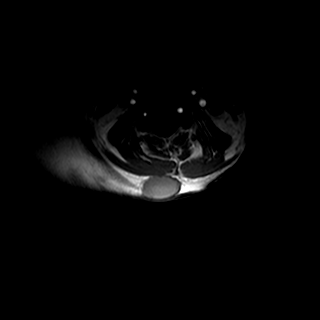

[Series 301: T2 · sagittal · 3.0mm · 0.49mm/px · 3 of 14 slices shown]
[im 1/14]
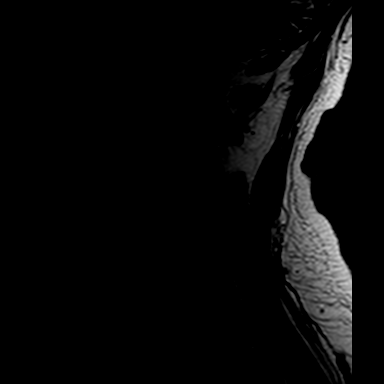
[im 7/14]
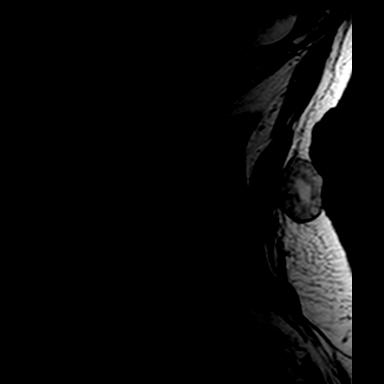
[im 14/14]
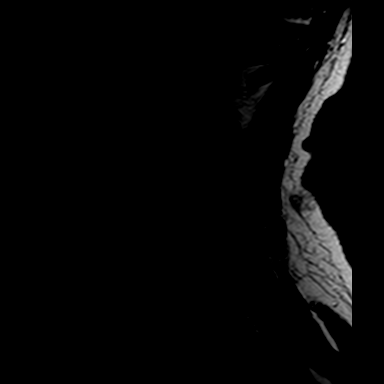

[Series 401: T1 · sagittal · 3.0mm · 0.49mm/px · 3 of 14 slices shown]
[im 1/14]
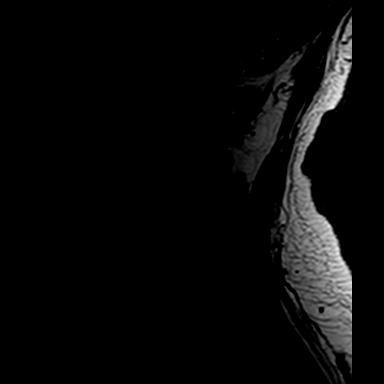
[im 7/14]
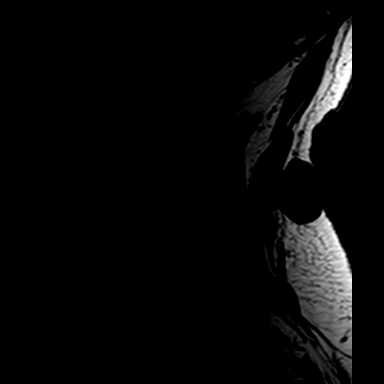
[im 14/14]
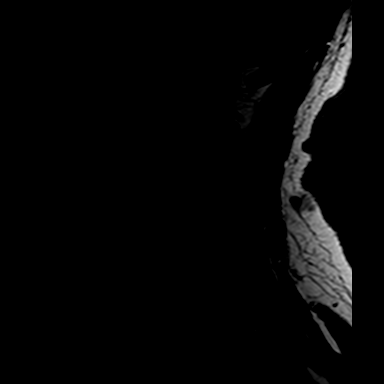

[Series 501: STIR · sagittal · 3.0mm · 0.59mm/px · 3 of 14 slices shown]
[im 1/14]
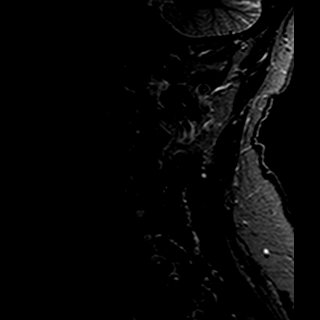
[im 7/14]
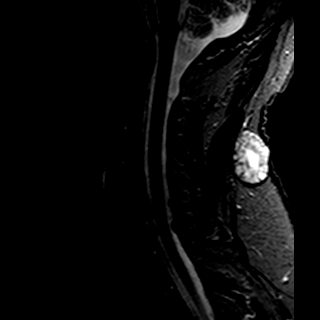
[im 14/14]
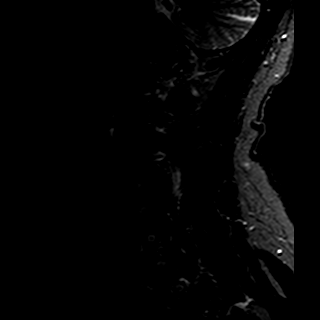

[Series 701: GRE · axial · 3.0mm · 0.47mm/px · z∈[-104,+9]mm · 17 of 78 slices shown]
[im 1/78]
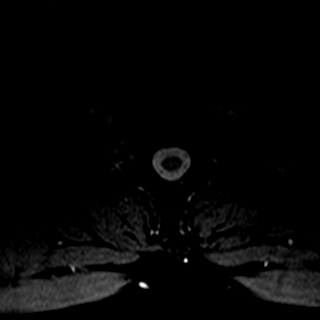
[im 5/78]
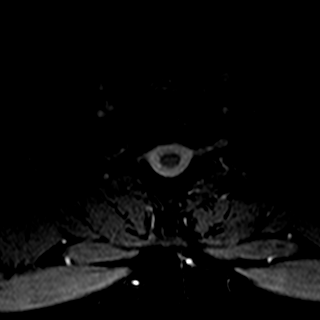
[im 10/78]
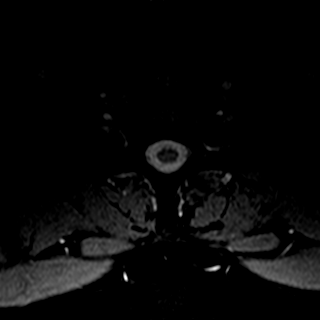
[im 15/78]
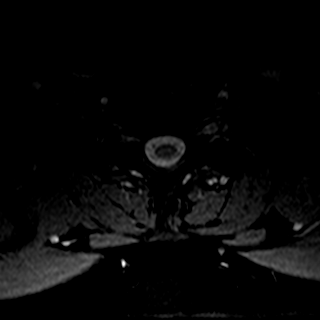
[im 20/78]
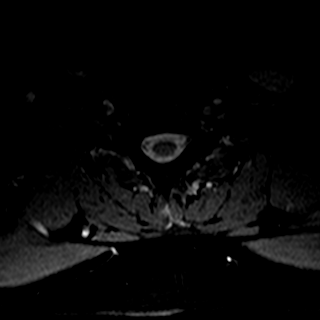
[im 25/78]
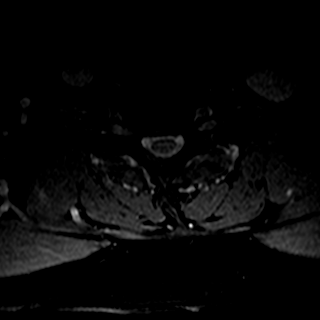
[im 29/78]
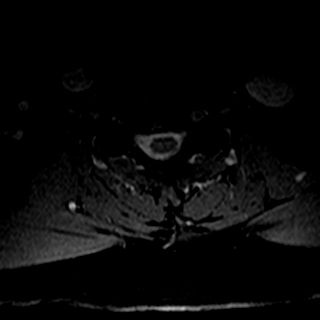
[im 34/78]
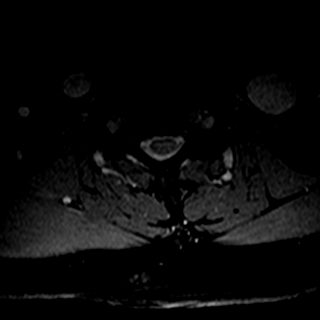
[im 39/78]
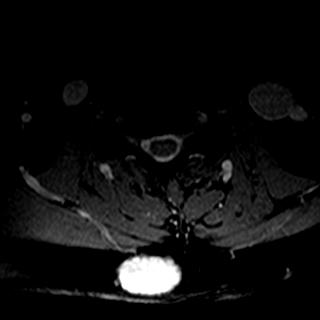
[im 44/78]
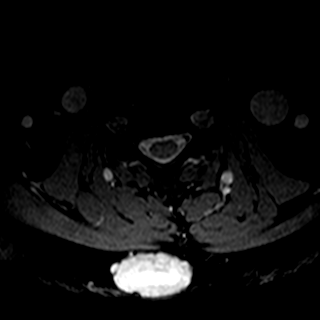
[im 49/78]
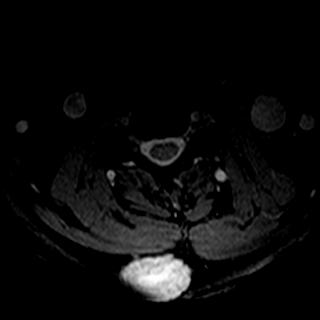
[im 53/78]
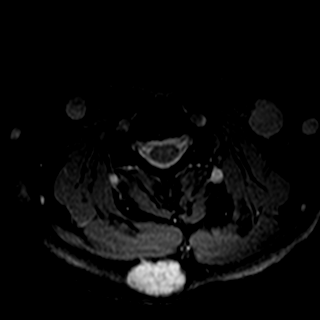
[im 58/78]
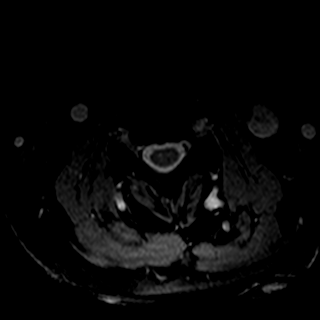
[im 63/78]
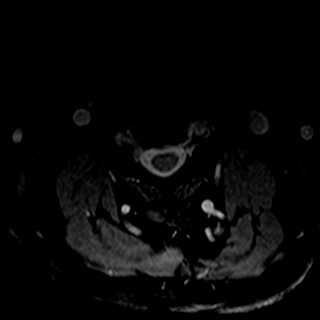
[im 68/78]
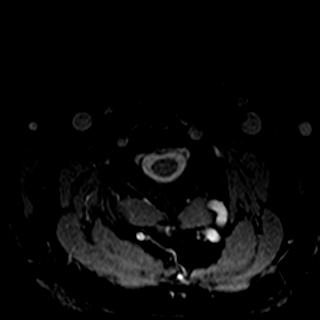
[im 73/78]
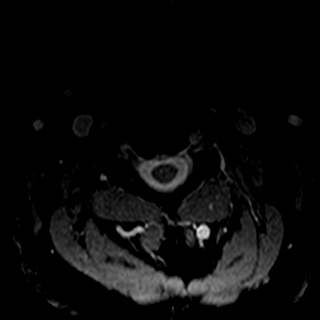
[im 78/78]
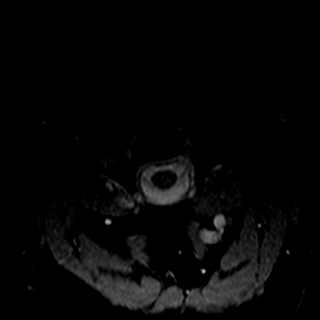

[Series 801: (person_name)2 ax disc · axial · 3.0mm · 0.47mm/px · z∈[-110,+7]mm · 4 of 18 slices shown]
[im 1/18]
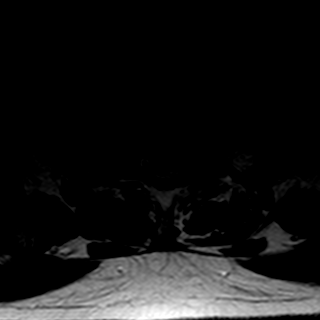
[im 6/18]
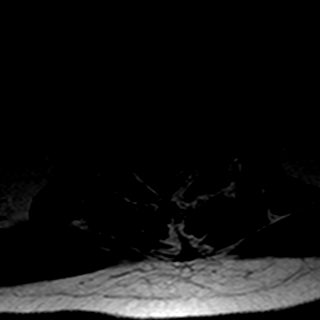
[im 12/18]
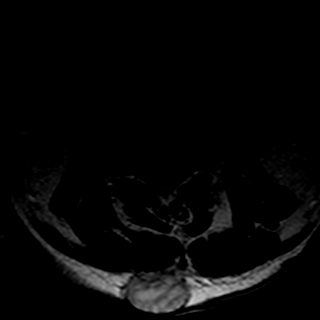
[im 18/18]
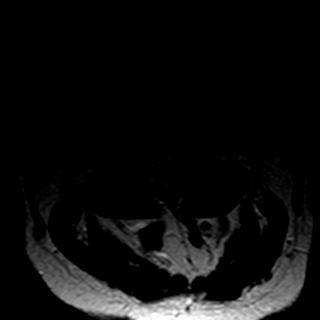

[31 of 48 positions shown; findings below may reference images not displayed]

FINDINGS: -------------------------------------------------------------------------------- 
----------------- 
GENERAL: 
ALIGNMENT: Normal coronal alignment. Slight loss of the normal cervical lordosis 
with otherwise anatomic sagittal alignment. 
VERTEBRAL BODY HEIGHT: Normal.  
MARROW SIGNAL: No focal suspect signal abnormality. 
CORD SIGNAL: Normal.  
ADDITIONAL FINDINGS: There is a nonspecific T1 hypointense, heterogeneous, 
mildly T2 hyperintense mass in the posterior subcutaneous soft tissues of the 
neck, extending from the mid C3 level through the mid C5 level. This mass 
measures 2.3 cm anterior posterior by 3.7 cm transverse by 3.1 cm craniocaudal. 
There is some central T2 hyperintensity which could reflect fluid or necrosis. 
It extends to abut the skin surface posteriorly. It is slightly to the right of 
midline. 
-------------------------------------------------------------------------------- 
---------------- 
SEGMENTAL: 
CRANIOCERVICAL JUNCTION: No significant stenosis. 
C2-C3: Normal disc height. No herniation. Normal facets. No spinal canal or 
neural foraminal stenosis. 
C3-C4: Normal disc height. No herniation. Normal facets. No spinal canal or 
neural foraminal stenosis. 
C4-C5: Normal disc height. Minimal generalized disc osteophyte complex of 
borderline canal stenosis. Patent right foramen. Mild left foraminal narrowing. 
Normal facets. 
C5-C6: Moderate loss of disc height with type II Modic end plate signal changes. 
Marginal osteophytes. Posterior ligamentous hypertrophy with mild generalized 
disc osteophyte complex and mild to moderate canal stenosis. Significant 
bilateral uncinate spurring with severe bilateral foraminal narrowing. Normal 
facets. 
C6-C7: Mild loss of disc height. Posterior ligamentous hypertrophy. Generalized 
disc osteophyte complex of mild to moderate canal stenosis. Mild-to-moderate 
right and moderate left foraminal narrowing. Normal facets. 
C7-T1: Normal disc height. No herniation. Normal facets. No spinal canal or 
neural foraminal stenosis. 
-------------------------------------------------------------------------------- 
---------------
IMPRESSION: Nonspecific posterior neck subcutaneous mass, detailed above. Further evaluation 
with contrast-enhanced neck MRI is recommended as well as surgical consultation. 
Cervical spondylosis. Most significant canal stenosis is at C5-C6 and C6-C7, 
mild-to-moderate. No abnormal cord signal. 
Multilevel foraminal narrowing, most significant at C5-C6. 
Findings will be phoned to the referring clinicians office.

## 2022-06-02 IMAGING — MR MRI LUMBAR WITHOUT CONTRAST
5 of 9 series · 27 of 48 positions shown · non-contrast
Comparison: Lumbar spine x-ray April 20, 2022

MRI LUMBAR WITHOUT CONTRAST , 06/02/2022 [DATE]: 
CLINICAL INDICATION: Lumbar degenerative disc disease. Worsening low back pain.
TECHNIQUE: Multiplanar, multiecho position MR images of the lumbar spine were 
performed without contrast.

[Series 301: T2 · sagittal · 4.0mm · 0.59mm/px · 5 of 17 slices shown (1 of 3)]
[im 1/17]
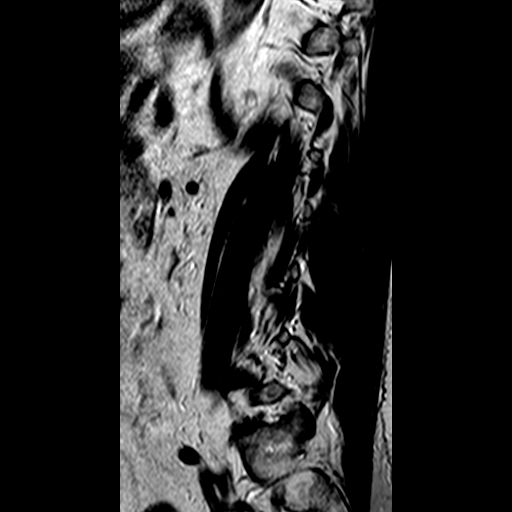
[im 5/17]
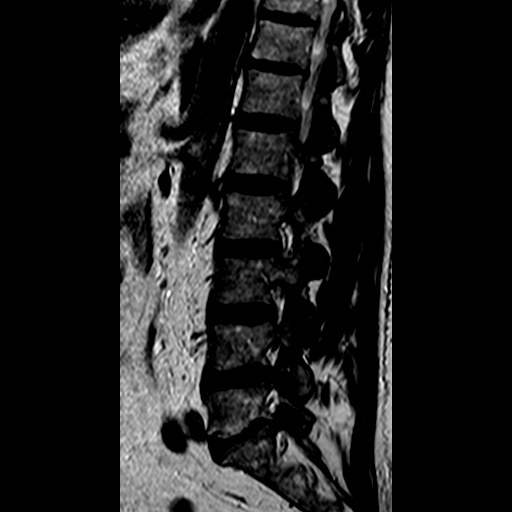
[im 9/17]
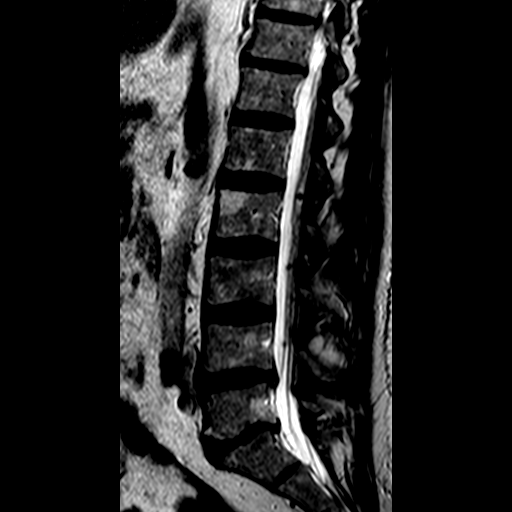
[im 13/17]
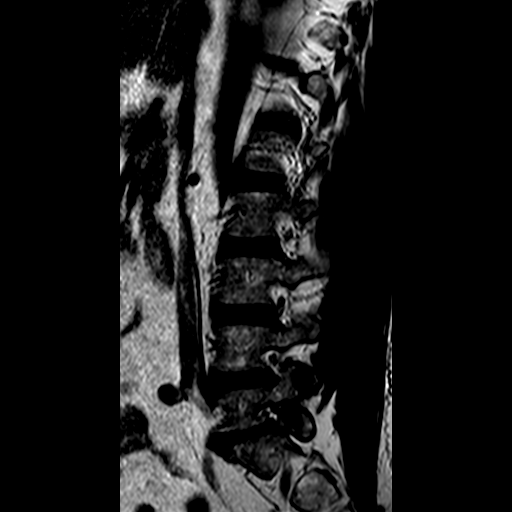
[im 17/17]
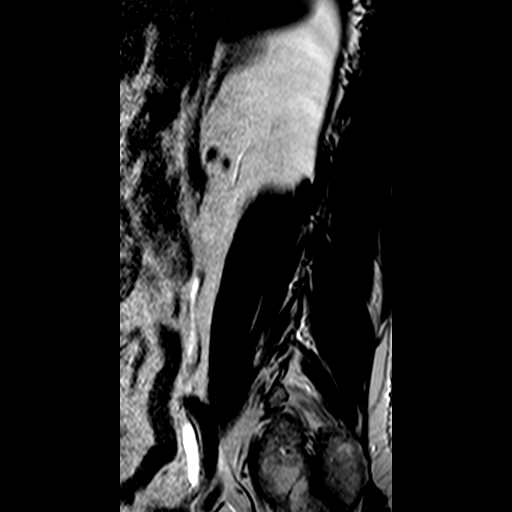

[Series 401: T1 · sagittal · 4.0mm · 0.58mm/px · 4 of 17 slices shown (1 of 2)]
[im 1/17]
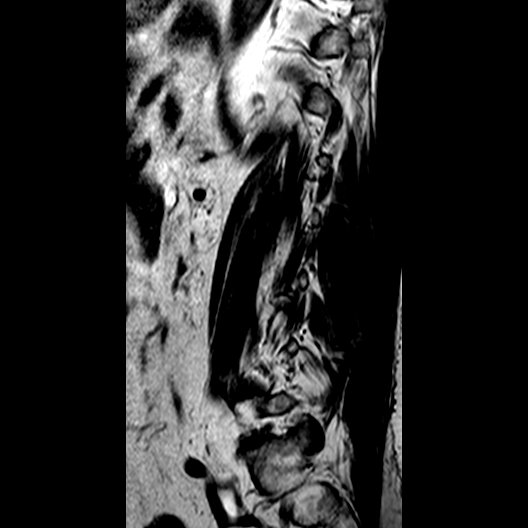
[im 6/17]
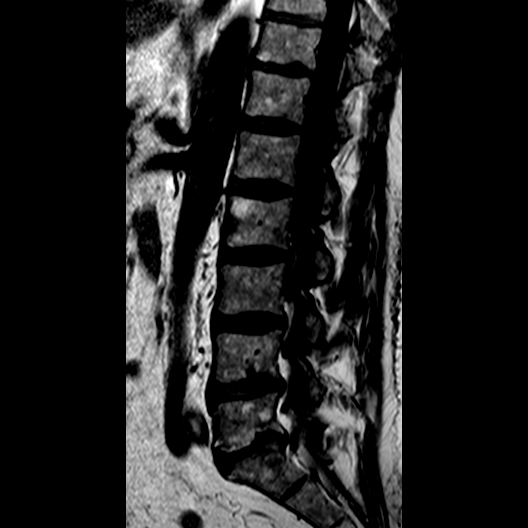
[im 11/17]
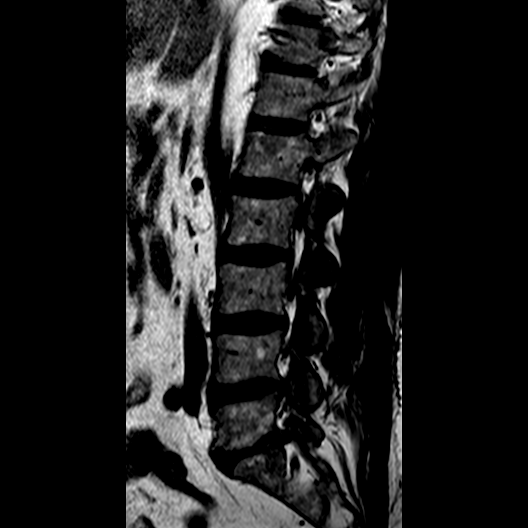
[im 17/17]
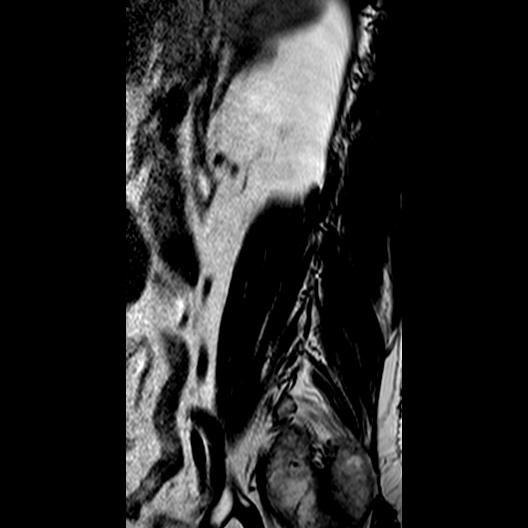

[Series 601: T2 · axial · 4.0mm · 0.48mm/px · z∈[-24,+120]mm · 8 of 34 slices shown (2 of 3)]
[im 1/34]
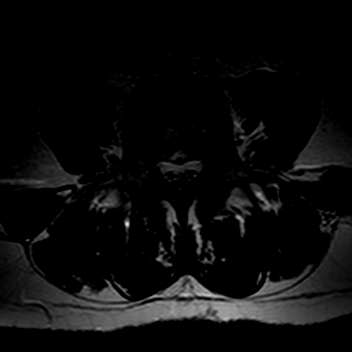
[im 5/34]
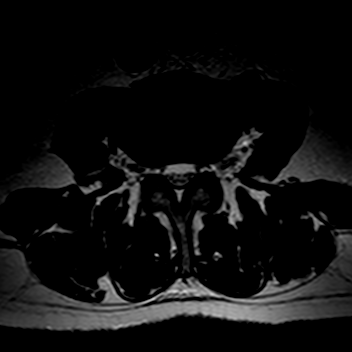
[im 10/34]
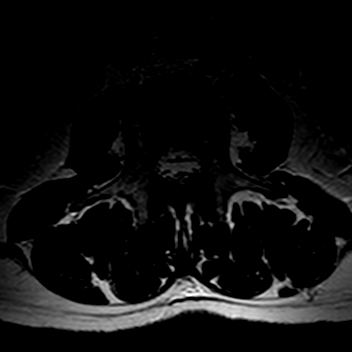
[im 15/34]
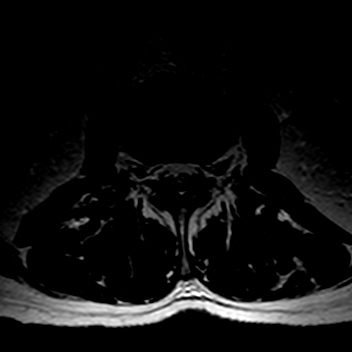
[im 19/34]
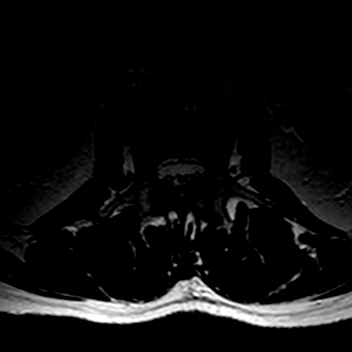
[im 24/34]
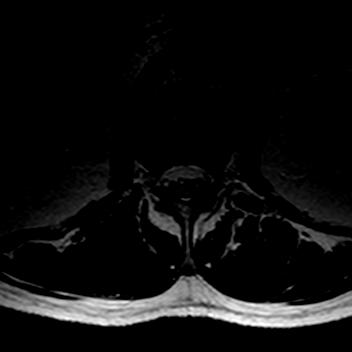
[im 29/34]
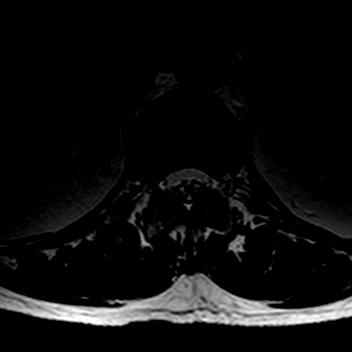
[im 34/34]
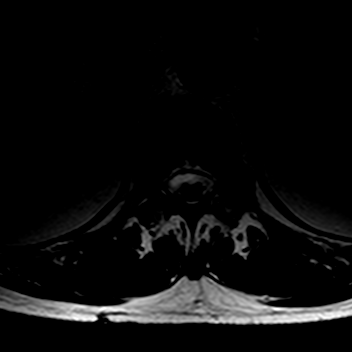

[Series 701: T2 · axial · 4.0mm · 0.48mm/px · z∈[-117,-7]mm · 6 of 26 slices shown (3 of 3)]
[im 1/26]
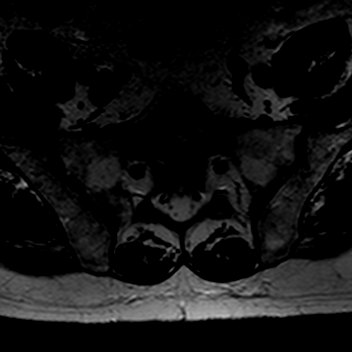
[im 6/26]
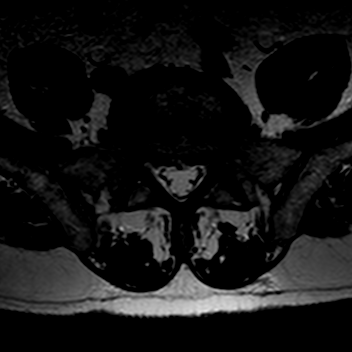
[im 11/26]
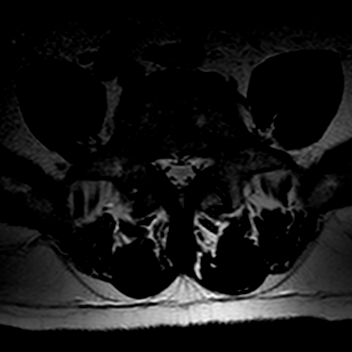
[im 16/26]
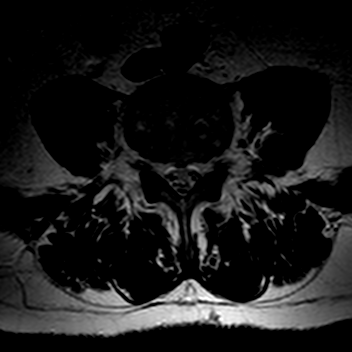
[im 21/26]
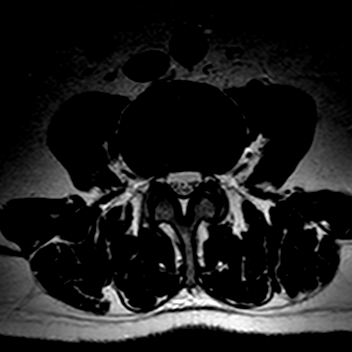
[im 26/26]
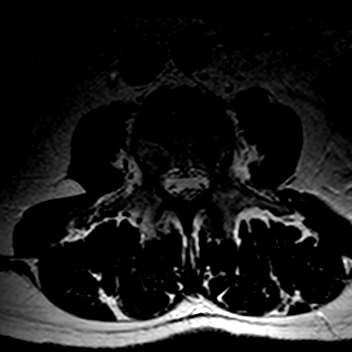

[Series 801: T1 · axial · 4.0mm · 0.48mm/px · z∈[-24,+37]mm · 4 of 34 slices shown (2 of 2)]
[im 1/34]
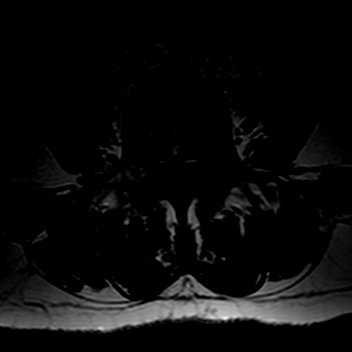
[im 5/34]
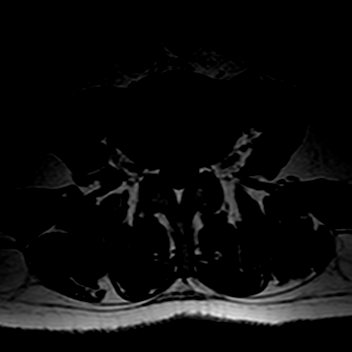
[im 10/34]
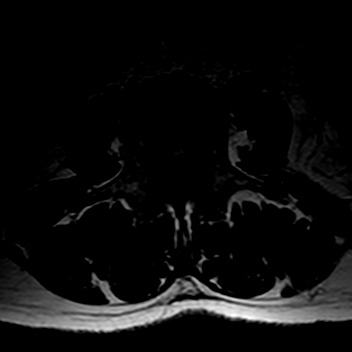
[im 15/34]
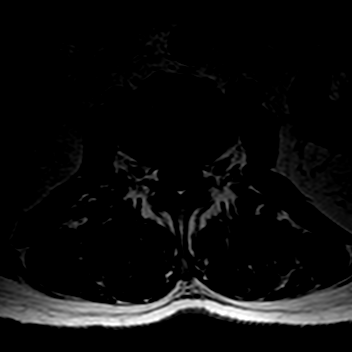

[27 of 48 positions shown; findings below may reference images not displayed]

FINDINGS: Component of congenital narrowing of the central canal due to short pedicles. 
-------------------------------------------------------------------------------- 
------ 
GENERAL: 
Nomenclature is based on 5 lumbar type vertebral bodies.     
ALIGNMENT: Normal coronal alignment. Loss of the normal lumbar lordosis with a 
stepwise grade 1 retrolisthesis L4 on L5 and L5 on S1. 
VERTEBRAL BODY HEIGHT: Normal.  
MARROW SIGNAL: T1 and T2 hypointense focus measuring 9 mm within the anterior 
aspect of the L2 vertebral body was sclerotic on the comparison radiographs. No 
evidence of increased STIR signal. This is likely a sclerotic bone island. 
Hemangioma noted in the superior aspect L2 vertebral body. No convincing 
evidence of a marrow infiltrative process with another hemangioma in the 
posterior aspect of L5. 
CORD SIGNAL: Normal distal spinal cord. There are nodules associated with the 
cauda equina nerve roots. These measure no greater than 3 mm. There is a nodule 
present L1-L2, at the L3 level, and at the L4 level. Conus medullaris terminates 
at L1. 
ADDITIONAL FINDINGS: None. 
Modic I-II: L5-S1 
Ligamentum Flavum > 2.5 mm: All levels. 
-------------------------------------------------------------------------------- 
------ 
SEGMENTAL: Mild degenerative changes lower thoracic spine. 
T12-L1: Loss of disc signal. Otherwise normal. 
L1-L2: Loss of disc signal. Otherwise normal. 
L2-L3: Loss of disc signal. Otherwise normal. 
L3-L4: Loss of disc signal with small Schmorls node. Borderline canal stenosis. 
Mild right foraminal narrowing. The left foramen is patent. Normal facets. 
L4-L5: Mild loss of disc height and signal. Central annular fissure. Central and 
right paracentral disc protrusion indents the ventral thecal sac and narrows the 
right lateral recess and could be affecting the traversing right L5 nerve root. 
Mild to moderate canal stenosis. Slight narrowing left lateral recess. Mild 
facet arthropathy. Foramina are patent bilaterally. 
L5-S1: Moderate loss of disc height and signal. Mild diffuse annular bulge. 
Canal patent. Mild facet arthropathy. Mild bilateral foraminal narrowing. 
-------------------------------------------------------------------------------- 
------
IMPRESSION: There are nonspecific nodules identified within several regions of the cauda 
equina, detailed above. While these may reflect nerve sheath tumors, other 
etiologies not excluded. A follow-up study consisting of MR lumbar spine without 
and with gadolinium suggested. 
Component of congenital narrowing of the central canal due to short pedicles. 
L4-L5, central annular fissure may be a pain generator. Narrowing of the right 
lateral recess by disc protrusion could affect traversing right L5 nerve root. 
Mild to moderate canal stenosis. 
Other less significant degenerative changes detailed above. 
Findings will be phoned to the referring clinicians office.
